# Patient Record
Sex: Female | Born: 1975 | Race: Black or African American | Hispanic: No | Smoking: Never smoker
Health system: Southern US, Community
[De-identification: ages and names within clinical notes are randomized; demographics above are authoritative.]

## PROBLEM LIST (undated history)

## (undated) DIAGNOSIS — I10 Essential (primary) hypertension: Secondary | ICD-10-CM

## (undated) DIAGNOSIS — G8929 Other chronic pain: Secondary | ICD-10-CM

## (undated) DIAGNOSIS — N1831 Chronic kidney disease, stage 3a: Secondary | ICD-10-CM

## (undated) DIAGNOSIS — R748 Abnormal levels of other serum enzymes: Secondary | ICD-10-CM

## (undated) DIAGNOSIS — E119 Type 2 diabetes mellitus without complications: Secondary | ICD-10-CM

## (undated) DIAGNOSIS — E785 Hyperlipidemia, unspecified: Secondary | ICD-10-CM

## (undated) DIAGNOSIS — R519 Headache, unspecified: Secondary | ICD-10-CM

## (undated) DIAGNOSIS — E1122 Type 2 diabetes mellitus with diabetic chronic kidney disease: Secondary | ICD-10-CM

## (undated) DIAGNOSIS — R000 Tachycardia, unspecified: Secondary | ICD-10-CM

## (undated) HISTORY — DX: Other chronic pain: G89.29

## (undated) HISTORY — DX: Type 2 diabetes mellitus without complications: E11.9

## (undated) HISTORY — DX: Hyperlipidemia, unspecified: E78.5

## (undated) HISTORY — DX: Essential (primary) hypertension: I10

## (undated) HISTORY — PX: PARTIAL HYSTERECTOMY: SHX80

## (undated) HISTORY — DX: Headache, unspecified: R51.9

---

## 2004-03-17 ENCOUNTER — Ambulatory Visit: Payer: Self-pay | Admitting: Obstetrics and Gynecology

## 2010-03-24 ENCOUNTER — Ambulatory Visit: Payer: Self-pay | Admitting: Obstetrics and Gynecology

## 2010-12-26 ENCOUNTER — Ambulatory Visit: Payer: Self-pay | Admitting: Obstetrics and Gynecology

## 2010-12-26 DIAGNOSIS — I10 Essential (primary) hypertension: Secondary | ICD-10-CM

## 2011-01-01 ENCOUNTER — Ambulatory Visit: Payer: Self-pay | Admitting: Obstetrics and Gynecology

## 2011-01-04 LAB — PATHOLOGY REPORT

## 2011-08-26 ENCOUNTER — Emergency Department: Payer: Self-pay | Admitting: Emergency Medicine

## 2011-08-26 LAB — COMPREHENSIVE METABOLIC PANEL
Albumin: 3.6 g/dL (ref 3.4–5.0)
Anion Gap: 6 — ABNORMAL LOW (ref 7–16)
BUN: 9 mg/dL (ref 7–18)
Chloride: 105 mmol/L (ref 98–107)
Co2: 27 mmol/L (ref 21–32)
Creatinine: 0.89 mg/dL (ref 0.60–1.30)
EGFR (African American): >60
Osmolality: 274 (ref 275–301)
Potassium: 3.5 mmol/L (ref 3.5–5.1)
SGOT(AST): 38 U/L — ABNORMAL HIGH (ref 15–37)
Total Protein: 8.4 g/dL — ABNORMAL HIGH (ref 6.4–8.2)

## 2011-08-26 LAB — CBC
HCT: 41.6 % (ref 35.0–47.0)
HGB: 13.7 g/dL (ref 12.0–16.0)
MCHC: 33 g/dL (ref 32.0–36.0)
WBC: 8.3 10*3/uL (ref 3.6–11.0)

## 2011-08-26 LAB — TROPONIN I: Troponin-I: <0.02 ng/mL

## 2013-06-30 ENCOUNTER — Ambulatory Visit: Payer: Self-pay | Admitting: Obstetrics and Gynecology

## 2014-06-02 ENCOUNTER — Emergency Department: Payer: Self-pay | Admitting: Emergency Medicine

## 2014-11-15 DIAGNOSIS — E669 Obesity, unspecified: Secondary | ICD-10-CM | POA: Insufficient documentation

## 2014-11-15 DIAGNOSIS — I10 Essential (primary) hypertension: Secondary | ICD-10-CM | POA: Insufficient documentation

## 2015-11-25 ENCOUNTER — Encounter (INDEPENDENT_AMBULATORY_CARE_PROVIDER_SITE_OTHER): Payer: 59 | Admitting: Ophthalmology

## 2015-11-25 DIAGNOSIS — H33301 Unspecified retinal break, right eye: Secondary | ICD-10-CM | POA: Diagnosis not present

## 2015-11-25 DIAGNOSIS — H43813 Vitreous degeneration, bilateral: Secondary | ICD-10-CM | POA: Diagnosis not present

## 2015-11-25 DIAGNOSIS — H35413 Lattice degeneration of retina, bilateral: Secondary | ICD-10-CM

## 2015-12-09 ENCOUNTER — Ambulatory Visit (INDEPENDENT_AMBULATORY_CARE_PROVIDER_SITE_OTHER): Payer: 59 | Admitting: Ophthalmology

## 2015-12-09 DIAGNOSIS — H33301 Unspecified retinal break, right eye: Secondary | ICD-10-CM

## 2015-12-23 ENCOUNTER — Ambulatory Visit (INDEPENDENT_AMBULATORY_CARE_PROVIDER_SITE_OTHER): Payer: 59 | Admitting: Ophthalmology

## 2015-12-23 DIAGNOSIS — H33301 Unspecified retinal break, right eye: Secondary | ICD-10-CM

## 2016-01-18 ENCOUNTER — Other Ambulatory Visit: Payer: Self-pay | Admitting: Obstetrics and Gynecology

## 2016-01-18 DIAGNOSIS — Z1231 Encounter for screening mammogram for malignant neoplasm of breast: Secondary | ICD-10-CM

## 2016-02-14 ENCOUNTER — Other Ambulatory Visit: Payer: Self-pay | Admitting: Obstetrics and Gynecology

## 2016-02-14 ENCOUNTER — Ambulatory Visit
Admission: RE | Admit: 2016-02-14 | Discharge: 2016-02-14 | Disposition: A | Discharge status: Home / Self Care | Payer: 59 | Source: Ambulatory Visit | Attending: Obstetrics and Gynecology | Admitting: Obstetrics and Gynecology

## 2016-02-14 DIAGNOSIS — Z1231 Encounter for screening mammogram for malignant neoplasm of breast: Secondary | ICD-10-CM | POA: Insufficient documentation

## 2016-04-27 ENCOUNTER — Ambulatory Visit (INDEPENDENT_AMBULATORY_CARE_PROVIDER_SITE_OTHER): Payer: 59 | Admitting: Ophthalmology

## 2016-05-18 ENCOUNTER — Ambulatory Visit (INDEPENDENT_AMBULATORY_CARE_PROVIDER_SITE_OTHER): Payer: 59 | Admitting: Ophthalmology

## 2016-05-18 DIAGNOSIS — H35413 Lattice degeneration of retina, bilateral: Secondary | ICD-10-CM

## 2016-05-18 DIAGNOSIS — H33301 Unspecified retinal break, right eye: Secondary | ICD-10-CM | POA: Diagnosis not present

## 2016-05-18 DIAGNOSIS — H43813 Vitreous degeneration, bilateral: Secondary | ICD-10-CM | POA: Diagnosis not present

## 2016-05-28 DIAGNOSIS — I10 Essential (primary) hypertension: Secondary | ICD-10-CM | POA: Diagnosis not present

## 2016-05-28 DIAGNOSIS — G43009 Migraine without aura, not intractable, without status migrainosus: Secondary | ICD-10-CM | POA: Diagnosis not present

## 2016-07-17 ENCOUNTER — Encounter: Payer: Self-pay | Admitting: *Deleted

## 2016-07-17 ENCOUNTER — Emergency Department
Admission: EM | Admit: 2016-07-17 | Discharge: 2016-07-17 | Disposition: A | Discharge status: Home / Self Care | Payer: 59 | Attending: Emergency Medicine | Admitting: Emergency Medicine

## 2016-07-17 DIAGNOSIS — I10 Essential (primary) hypertension: Secondary | ICD-10-CM

## 2016-07-17 DIAGNOSIS — R040 Epistaxis: Secondary | ICD-10-CM | POA: Diagnosis present

## 2016-07-17 MED ORDER — OXYMETAZOLINE HCL 0.05 % NA SOLN
3.0000 | Freq: Once | NASAL | Status: AC
  Administered 2016-07-17: 3 via NASAL
  Filled 2016-07-17: qty 15

## 2016-07-17 MED ORDER — TRANEXAMIC ACID 1000 MG/10ML IV SOLN
500.0000 mg | Freq: Once | INTRAVENOUS | Status: AC
  Administered 2016-07-17: 500 mg via TOPICAL
  Filled 2016-07-17: qty 10

## 2016-07-17 NOTE — ED Notes (Signed)
Pt reports 1hr PTA she woke up from sleep with nose bleed. Pt states she tried to stop it at home which did occur however the bleeding returned and "began to bleed from my eye so we came in." Pt denies hx of epistaxis, allergies or inj. Pt able to speak without difficulty. EDP in rm, clamp applied to nose. No bleeding from eyes visualized at this time. Pt A&O at this time.

## 2016-07-17 NOTE — ED Notes (Signed)
EDP aware of pts BP 

## 2016-07-17 NOTE — ED Provider Notes (Signed)
[facility] Emergency Department Provider Note  ____________________________________________   First MD Initiated Contact with Patient 07/17/16 0304     (approximate)  I have reviewed the triage vital signs and the nursing notes.   HISTORY  Chief Complaint Epistaxis    HPI Anne Harvey is a 41 y.o. female who comes to the emergency department with half hour of nontraumatic epistaxis. This has never happened before. She takes no blood thinners or antiplatelet agents. She was lying in bed when she began to bleed. She pinched her nose tilted her head backwards and it made her nauseated. She is thrown up once.   No past medical history on file.  There are no active problems to display for this patient.   No past surgical history on file.  Prior to Admission medications   Not on File    Allergies Morphine and related  Family History  Problem Relation Age of Onset  . Ovarian cancer Paternal Grandmother     Social History Social History  Substance Use Topics  . Smoking status: Never Smoker  . Smokeless tobacco: Never Used  . Alcohol use No    Review of Systems Constitutional: No fever/chills Eyes: No visual changes. ENT: No sore throat. Cardiovascular: Denies chest pain. Respiratory: Denies shortness of breath. Gastrointestinal: No abdominal pain.  No nausea, no vomiting.  No diarrhea.  No constipation. Genitourinary: Negative for dysuria. Musculoskeletal: Negative for back pain. Skin: Negative for rash. Neurological: Negative for headaches, focal weakness or numbness.  10-point ROS otherwise negative.  ____________________________________________   PHYSICAL EXAM:  VITAL SIGNS: ED Triage Vitals  Enc Vitals Group     BP 07/17/16 0255 (!) 237/135     Pulse Rate 07/17/16 0252 79     Resp 07/17/16 0252 20     Temp 07/17/16 0252 98.3 F (36.8 C)     Temp Source 07/17/16 0252 Oral     SpO2 07/17/16 0252 99 %     Weight  07/17/16 0253 200 lb (90.7 kg)     Height 07/17/16 0253  (1.651 m)     Head Circumference --      Peak Flow --      Pain Score --      Pain Loc --      Pain Edu? --      Excl. in GC? --     Constitutional: Alert and oriented x 4 well appearing nontoxic no diaphoresis speaks in full, clear sentences Eyes: PERRL EOMI.Mild bleeding around the inferior aspect of her left eye Head: Atraumatic. Nose: No congestion/rhinnorhea. Mouth/Throat: Bilateral anterior epistaxis. Normal oropharynx with no evidence of bleed Neck: No stridor.   Cardiovascular: Normal rate, regular rhythm. Grossly normal heart sounds.  Good peripheral circulation. Respiratory: Normal respiratory effort.  No retractions. Lungs CTAB and moving good air Musculoskeletal: No lower extremity edema   Neurologic:  Normal speech and language. No gross focal neurologic deficits are appreciated. Skin:  Skin is warm, dry and intact. No rash noted. Psychiatric: Mood and affect are normal. Speech and behavior are normal.    ____________________________________________   DIFFERENTIAL  Anterior epistaxis, posterior epistaxis ____________________________________________   LABS (all labs ordered are listed, but only abnormal results are displayed)  Labs Reviewed - No data to display   __________________________________________  EKG   ____________________________________________  RADIOLOGY   ____________________________________________   PROCEDURES  Procedure(s) performed: no  Procedures  Critical Care performed: no  ____________________________________________   INITIAL IMPRESSION / ASSESSMENT AND PLAN /  ED COURSE  Pertinent labs & imaging results that were available during my care of the patient were reviewed by me and considered in my medical decision making (see chart for details).  I initially had the patient blow her nose and a large amount of clot came out. I then packed each nares with a 2 x  2 that was soaked in tranexamic acid in pinched her nose together and held it for half hour. After removing the packing she continued a slow ooze. I then had her blow her nose and spray Afrin 3 times each nostril and clamped her nose for 20 minutes. When we removed it she had a slight Shukla blood and then it stopped. At this point I offered the patient packing versus continued pressure and waiting at home. She declines packing and states she will come back if her nosebleed continues. She is discharged home in improved and good condition.      ____________________________________________   FINAL CLINICAL IMPRESSION(S) / ED DIAGNOSES  Final diagnoses:  Acute anterior epistaxis  Hypertension, unspecified type      NEW MEDICATIONS STARTED DURING THIS VISIT:  New Prescriptions   No medications on file     Note:  This document was prepared using Dragon voice recognition software and may include unintentional dictation errors.     Merrily Brittle, MD 07/17/16 [phone]

## 2016-07-17 NOTE — ED Notes (Signed)

## 2016-07-17 NOTE — Discharge Instructions (Signed)
Please return to the emergency department for any new or worsening symptoms such as if his nose starts bleeding again or for any other concerns. Today her blood pressure was quite high, however part of this was because of the pain of the nosebleed. Regardless I need you to follow up with your PMD within 1 week for a blood pressure recheck.  It was a pleasure to take care of you today, and thank you for coming to our emergency department.  If you have any questions or concerns before leaving please ask the nurse to grab me and I'm more than happy to go through your aftercare instructions again.  If you were prescribed any opioid pain medication today such as Norco, Vicodin, Percocet, morphine, hydrocodone, or oxycodone please make sure you do not drive when you are taking this medication as it can alter your ability to drive safely.  If you have any concerns once you are home that you are not improving or are in fact getting worse before you can make it to your follow-up appointment, please do not hesitate to call 911 and come back for further evaluation.  Merrily Brittle MD

## 2016-07-17 NOTE — ED Triage Notes (Signed)
Pt states she woke up tonight with a nosebleed.  No bleeding at this time.  Pt has htn and took meds today.  Pt alert.  Speech clear.

## 2016-07-17 NOTE — ED Notes (Signed)
Pt reports she has hx of HTN, reports she takes Lisinopril in the am daily.

## 2016-08-14 DIAGNOSIS — R040 Epistaxis: Secondary | ICD-10-CM | POA: Insufficient documentation

## 2016-10-29 DIAGNOSIS — R399 Unspecified symptoms and signs involving the genitourinary system: Secondary | ICD-10-CM | POA: Diagnosis not present

## 2016-10-29 DIAGNOSIS — N760 Acute vaginitis: Secondary | ICD-10-CM | POA: Diagnosis not present

## 2016-10-29 DIAGNOSIS — B9689 Other specified bacterial agents as the cause of diseases classified elsewhere: Secondary | ICD-10-CM | POA: Diagnosis not present

## 2017-01-16 DIAGNOSIS — I10 Essential (primary) hypertension: Secondary | ICD-10-CM | POA: Diagnosis not present

## 2017-01-16 DIAGNOSIS — Z0000 Encounter for general adult medical examination without abnormal findings: Secondary | ICD-10-CM | POA: Diagnosis not present

## 2017-01-22 DIAGNOSIS — Z01419 Encounter for gynecological examination (general) (routine) without abnormal findings: Secondary | ICD-10-CM | POA: Diagnosis not present

## 2017-01-22 DIAGNOSIS — Z1231 Encounter for screening mammogram for malignant neoplasm of breast: Secondary | ICD-10-CM | POA: Diagnosis not present

## 2017-01-28 DIAGNOSIS — M779 Enthesopathy, unspecified: Secondary | ICD-10-CM | POA: Diagnosis not present

## 2017-01-28 DIAGNOSIS — S56912A Strain of unspecified muscles, fascia and tendons at forearm level, left arm, initial encounter: Secondary | ICD-10-CM | POA: Diagnosis not present

## 2017-03-21 ENCOUNTER — Other Ambulatory Visit: Payer: Self-pay | Admitting: Obstetrics and Gynecology

## 2017-03-21 DIAGNOSIS — Z1231 Encounter for screening mammogram for malignant neoplasm of breast: Secondary | ICD-10-CM

## 2017-04-18 ENCOUNTER — Ambulatory Visit
Admission: RE | Admit: 2017-04-18 | Discharge: 2017-04-18 | Disposition: A | Discharge status: Home / Self Care | Payer: 59 | Source: Ambulatory Visit | Attending: Obstetrics and Gynecology | Admitting: Obstetrics and Gynecology

## 2017-04-18 DIAGNOSIS — Z1231 Encounter for screening mammogram for malignant neoplasm of breast: Secondary | ICD-10-CM | POA: Diagnosis present

## 2017-05-24 ENCOUNTER — Ambulatory Visit (INDEPENDENT_AMBULATORY_CARE_PROVIDER_SITE_OTHER): Payer: 59 | Admitting: Ophthalmology

## 2017-07-26 DIAGNOSIS — I10 Essential (primary) hypertension: Secondary | ICD-10-CM | POA: Diagnosis not present

## 2018-03-04 DIAGNOSIS — S86811A Strain of other muscle(s) and tendon(s) at lower leg level, right leg, initial encounter: Secondary | ICD-10-CM | POA: Diagnosis not present

## 2018-04-10 DIAGNOSIS — J101 Influenza due to other identified influenza virus with other respiratory manifestations: Secondary | ICD-10-CM | POA: Diagnosis not present

## 2018-04-10 DIAGNOSIS — R52 Pain, unspecified: Secondary | ICD-10-CM | POA: Diagnosis not present

## 2018-04-10 DIAGNOSIS — R509 Fever, unspecified: Secondary | ICD-10-CM | POA: Diagnosis not present

## 2018-04-23 ENCOUNTER — Other Ambulatory Visit: Payer: Self-pay | Admitting: Family Medicine

## 2018-04-23 DIAGNOSIS — Z1231 Encounter for screening mammogram for malignant neoplasm of breast: Secondary | ICD-10-CM

## 2018-05-15 ENCOUNTER — Ambulatory Visit
Admission: RE | Admit: 2018-05-15 | Discharge: 2018-05-15 | Disposition: A | Discharge status: Home / Self Care | Payer: Managed Care, Other (non HMO) | Source: Ambulatory Visit | Attending: Family Medicine | Admitting: Family Medicine

## 2018-05-15 DIAGNOSIS — Z1231 Encounter for screening mammogram for malignant neoplasm of breast: Secondary | ICD-10-CM | POA: Diagnosis not present

## 2018-11-18 ENCOUNTER — Other Ambulatory Visit: Payer: Self-pay

## 2018-11-18 DIAGNOSIS — Z20822 Contact with and (suspected) exposure to covid-19: Secondary | ICD-10-CM

## 2018-11-19 LAB — NOVEL CORONAVIRUS, NAA: SARS-CoV-2, NAA: NOT DETECTED

## 2018-11-20 ENCOUNTER — Telehealth: Payer: Self-pay | Admitting: Family Medicine

## 2018-11-20 NOTE — Telephone Encounter (Signed)
Pt given Covid-19 results °

## 2019-03-31 DIAGNOSIS — R7309 Other abnormal glucose: Secondary | ICD-10-CM | POA: Insufficient documentation

## 2019-03-31 DIAGNOSIS — E785 Hyperlipidemia, unspecified: Secondary | ICD-10-CM | POA: Insufficient documentation

## 2019-07-01 ENCOUNTER — Other Ambulatory Visit: Payer: Self-pay | Admitting: Family Medicine

## 2019-07-01 DIAGNOSIS — Z1231 Encounter for screening mammogram for malignant neoplasm of breast: Secondary | ICD-10-CM

## 2019-07-11 ENCOUNTER — Ambulatory Visit: Payer: Managed Care, Other (non HMO) | Attending: Internal Medicine

## 2019-07-11 DIAGNOSIS — Z23 Encounter for immunization: Secondary | ICD-10-CM

## 2019-07-11 NOTE — Progress Notes (Signed)
   Covid-19 Vaccination Clinic  Name:  Anne Harvey    MRN: [id number] DOB: 16-Feb-1976  07/11/2019  Anne Harvey was observed post Covid-19 immunization for 15 minutes without incident. She was provided with Vaccine Information Sheet and instruction to access the V-Safe system.   Anne Harvey was instructed to call 911 with any severe reactions post vaccine: Marland Kitchen Difficulty breathing  . Swelling of face and throat  . A fast heartbeat  . A bad rash all over body  . Dizziness and weakness   Immunizations Administered    Name Date Dose VIS Date Route   Pfizer COVID-19 Vaccine 07/11/2019  5:29 PM 0.3 mL 03/27/2019 Intramuscular   Manufacturer: [employer], [employer]   Lot: [id number]   NDC: [address]-2

## 2019-08-01 ENCOUNTER — Ambulatory Visit: Payer: Managed Care, Other (non HMO) | Attending: Internal Medicine

## 2019-08-01 DIAGNOSIS — Z23 Encounter for immunization: Secondary | ICD-10-CM

## 2019-08-01 NOTE — Progress Notes (Signed)
   Covid-19 Vaccination Clinic  Name:  Anne Harvey    MRN: [id number] DOB: 1975/06/23  08/01/2019  Anne Harvey was observed post Covid-19 immunization for 15 minutes without incident. She was provided with Vaccine Information Sheet and instruction to access the V-Safe system.   Anne Harvey was instructed to call 911 with any severe reactions post vaccine: Marland Kitchen Difficulty breathing  . Swelling of face and throat  . A fast heartbeat  . A bad rash all over body  . Dizziness and weakness   Immunizations Administered    Name Date Dose VIS Date Route   Pfizer COVID-19 Vaccine 08/01/2019  3:10 PM 0.3 mL 03/27/2019 Intramuscular   Manufacturer: [employer], [employer]   Lot: [id number]   NDC: [address]-6

## 2019-09-15 ENCOUNTER — Ambulatory Visit
Admission: RE | Admit: 2019-09-15 | Discharge: 2019-09-15 | Disposition: A | Discharge status: Home / Self Care | Payer: Managed Care, Other (non HMO) | Source: Ambulatory Visit | Attending: Family Medicine | Admitting: Family Medicine

## 2019-09-15 DIAGNOSIS — Z1231 Encounter for screening mammogram for malignant neoplasm of breast: Secondary | ICD-10-CM | POA: Diagnosis not present

## 2020-12-05 ENCOUNTER — Encounter: Payer: Managed Care, Other (non HMO) | Attending: Family Medicine | Admitting: *Deleted

## 2020-12-05 ENCOUNTER — Encounter: Payer: Self-pay | Admitting: *Deleted

## 2020-12-05 ENCOUNTER — Other Ambulatory Visit: Payer: Self-pay

## 2020-12-05 VITALS — BP 124/90 | Ht 65.5 in | Wt 222.5 lb

## 2020-12-05 DIAGNOSIS — Z6836 Body mass index (BMI) 36.0-36.9, adult: Secondary | ICD-10-CM | POA: Insufficient documentation

## 2020-12-05 DIAGNOSIS — E119 Type 2 diabetes mellitus without complications: Secondary | ICD-10-CM | POA: Diagnosis not present

## 2020-12-05 DIAGNOSIS — E669 Obesity, unspecified: Secondary | ICD-10-CM | POA: Insufficient documentation

## 2020-12-05 DIAGNOSIS — Z713 Dietary counseling and surveillance: Secondary | ICD-10-CM | POA: Diagnosis not present

## 2020-12-05 NOTE — Progress Notes (Signed)
Diabetes Self-Management Education  Visit Type: First/Initial  Appt. Start Time: 1550 Appt. End Time: 1700  12/05/2020  [name], identified by name and date of birth, is a 45 y.o. female with a diagnosis of Diabetes: Type 2.   ASSESSMENT  Blood pressure 124/90, height 5' 5.5" (1.664 m), weight 222 lb 8 oz (100.9 kg). Body mass index is 36.46 kg/m.   Diabetes Self-Management Education - 12/05/20 1911       Visit Information   Visit Type First/Initial      Initial Visit   Diabetes Type Type 2    Are you currently following a meal plan? No    Are you taking your medications as prescribed? Yes    Date Diagnosed she reports "hasn't been officially diagnosed with Diabetes" - A1C of 6.6 %      Health Coping   How would you rate your overall health? Good      Psychosocial Assessment   Patient Belief/Attitude about Diabetes Other (comment)   "nervous"   Self-care barriers None    Self-management support Doctor's office;Family    Patient Concerns Nutrition/Meal planning;Weight Control;Glycemic Control;Medication;Healthy Lifestyle    Special Needs None    Preferred Learning Style Auditory;Visual;Hands on    Learning Readiness Ready    How often do you need to have someone help you when you read instructions, pamphlets, or other written materials from your doctor or pharmacy? 1 - Never    What is the last grade level you completed in school? some college      Pre-Education Assessment   Patient understands the diabetes disease and treatment process. Needs Instruction    Patient understands incorporating nutritional management into lifestyle. Needs Instruction    Patient undertands incorporating physical activity into lifestyle. Needs Instruction    Patient understands using medications safely. Needs Instruction    Patient understands monitoring blood glucose, interpreting and using results Needs Instruction    Patient understands prevention, detection, and treatment of  acute complications. Needs Instruction    Patient understands prevention, detection, and treatment of chronic complications. Needs Instruction    Patient understands how to develop strategies to address psychosocial issues. Needs Instruction    Patient understands how to develop strategies to promote health/change behavior. Needs Instruction      Complications   Last HgB A1C per patient/outside source 6.6 %   10/19/2020   How often do you check your blood sugar? 0 times/day (not testing)   Provided One Touch Verio Reflect and instructed on use. BG upon return demonstration was 136 mg/dL at 4:50 pm - 2 hrs pp.   Have you had a dilated eye exam in the past 12 months? Yes    Have you had a dental exam in the past 12 months? No    Are you checking your feet? No      Dietary Intake   Breakfast 2 eggs with bacon, sometimes fruit (grapes, watermelon, strawberries, cantaloup, occasional banana)    Lunch sometimes skips - salad, hamburger, noodles, only water - has Chief Technology Officer, fish, pork chops, beef, roast; potatoes, pasta, mac-n-cheese, pinto beans, carrots, greens, broccoli, asparagus, green beans    Beverage(s) water, diet soda, artificial sweeteners like crystal light      Exercise   Exercise Type ADL's      Patient Education   Previous Diabetes Education No    Disease state  Definition of diabetes, type 1 and 2, and the diagnosis of diabetes;Factors that contribute to  the development of diabetes    Nutrition management  Role of diet in the treatment of diabetes and the relationship between the three main macronutrients and blood glucose level;Food label reading, portion sizes and measuring food.;Reviewed blood glucose goals for pre and post meals and how to evaluate the patients' food intake on their blood glucose level.    Physical activity and exercise  Role of exercise on diabetes management, blood pressure control and cardiac health.    Monitoring Taught/evaluated SMBG  meter.;Purpose and frequency of SMBG.;Taught/discussed recording of test results and interpretation of SMBG.;Identified appropriate SMBG and/or A1C goals.    Chronic complications Relationship between chronic complications and blood glucose control    Psychosocial adjustment Role of stress on diabetes;Identified and addressed patients feelings and concerns about diabetes      Individualized Goals (developed by patient)   Reducing Risk Other (comment)   improve blood sugars, decrease medications, lose weight, lead a healthier lifestyle     Outcomes   Expected Outcomes Demonstrated interest in learning. Expect positive outcomes    Future DMSE 4-6 wks             Individualized Plan for Diabetes Self-Management Training:   Learning Objective:  Patient will have a greater understanding of diabetes self-management. Patient education plan is to attend individual and/or group sessions per assessed needs and concerns.   Plan:   Patient Instructions  Check blood sugars 2 x day before breakfast and 2 hrs after supper 3 x week Bring blood sugar records to the next appointment  Call your doctor for a prescription for:  1. Meter strips (type)  One Touch Verio  checking  3   times per week  2. Lancets (type)  One Touch Delica  checking  3   times per week  Exercise:  Begin walking  for    15  minutes   3  days a week and gradually increase to 30 minutes 5 x week  Eat 3 meals day,   1  snack a day Space meals 4-6 hours apart Don't' skip meals - eat at least 1 protein and 1 carbohydrate serving Complete 3 Day Food Record and bring to next appt  Return for appointment on:  Thursday January 12, 2021 at 3:15 pm with Pam (dietitian)   Expected Outcomes:  Demonstrated interest in learning. Expect positive outcomes  Education material provided:  General Meal Planning Guidelines Simple Meal Plan Meter = One Touch Verio Reflect 3 Day Food Record  If problems or questions, patient to  contact team via:   Johny Drilling, RN, [place] [phone]  Future DSME appointment: 4-6 wks January 12, 2021 with the dietitian

## 2020-12-05 NOTE — Patient Instructions (Signed)
Check blood sugars 2 x day before breakfast and 2 hrs after supper 3 x week Bring blood sugar records to the next appointment  Call your doctor for a prescription for:  1. Meter strips (type)  One Touch Verio  checking  3   times per week  2. Lancets (type)  One Touch Delica  checking  3   times per week  Exercise:  Begin walking  for    15  minutes   3  days a week and gradually increase to 30 minutes 5 x week  Eat 3 meals day,   1  snack a day Space meals 4-6 hours apart Don't' skip meals - eat at least 1 protein and 1 carbohydrate serving Complete 3 Day Food Record and bring to next appt  Return for appointment on:  Thursday January 12, 2021 at 3:15 pm with [facility] (dietitian)

## 2021-01-12 ENCOUNTER — Ambulatory Visit: Payer: Managed Care, Other (non HMO) | Admitting: Dietician

## 2021-01-13 ENCOUNTER — Encounter: Payer: Self-pay | Admitting: Dietician

## 2021-01-13 NOTE — Progress Notes (Signed)
[name] has rescheduled her appointment from 01/12/21 to 03/04/21 to complete her diabetes education.

## 2021-02-24 DIAGNOSIS — M179 Osteoarthritis of knee, unspecified: Secondary | ICD-10-CM | POA: Insufficient documentation

## 2021-03-02 ENCOUNTER — Ambulatory Visit: Payer: Managed Care, Other (non HMO) | Admitting: Dietician

## 2021-03-15 ENCOUNTER — Encounter: Payer: Self-pay | Admitting: Podiatry

## 2021-03-15 ENCOUNTER — Other Ambulatory Visit: Payer: Self-pay

## 2021-03-15 ENCOUNTER — Ambulatory Visit: Payer: Managed Care, Other (non HMO) | Admitting: Podiatry

## 2021-03-15 DIAGNOSIS — L03032 Cellulitis of left toe: Secondary | ICD-10-CM | POA: Diagnosis not present

## 2021-03-15 DIAGNOSIS — L03039 Cellulitis of unspecified toe: Secondary | ICD-10-CM

## 2021-03-15 MED ORDER — CEPHALEXIN 500 MG PO CAPS: 500.0000 mg | ORAL_CAPSULE | Freq: Three times a day (TID) | ORAL | 0 refills | Status: AC

## 2021-03-15 NOTE — Progress Notes (Signed)
  Subjective:  Patient ID: Anne Harvey, female    DOB: 10-02-1975,  MRN: [id number]  Chief Complaint  Patient presents with   Ingrown Toenail    np - ingrown left great nail    45 y.o. female presents with the above complaint. History confirmed with patient.  They become painful and infected about 3 to 4 months ago and really worsened over the last month its been blistering and having purulent drainage  Objective:  Physical Exam: warm, good capillary refill, no trophic changes or ulcerative lesions, normal DP and PT pulses, and normal sensory exam. Left Foot: normal exam, no swelling, tenderness, instability; ligaments intact, full range of motion of all ankle/foot joints Right Foot: Loosening of hallux nail with paronychia that extends from the lateral border to the proximal nail fold  Assessment:   1. Paronychia of great toe      Plan:  Patient was evaluated and treated and all questions answered.  Has paronychia of nearly the entire nail plate with extension of the proximal nail fold.  I recommended I&D with temporary avulsion of the nail plate.  I think the causative issue was an ingrown toenail.  After sterile prep with Betadine and a digital block with lidocaine and Marcaine I remove the entire nail plate and did not perform a matricectomy.  I will see her back as needed and let the toenail regrow and if the ingrown nail becomes symptomatic and we will plan for partial permanent nail avulsion  Return if symptoms worsen or fail to improve.

## 2021-03-15 NOTE — Patient Instructions (Signed)

## 2021-03-22 ENCOUNTER — Encounter: Payer: Self-pay | Admitting: Dietician

## 2021-03-22 NOTE — Progress Notes (Signed)
Have not heard back from patient to reschedule her second cancelled appointment from 03/02/21. Sent notification to referring provider.

## 2021-04-05 ENCOUNTER — Ambulatory Visit: Payer: Managed Care, Other (non HMO) | Admitting: Podiatry

## 2021-04-05 ENCOUNTER — Other Ambulatory Visit: Payer: Self-pay

## 2021-04-05 DIAGNOSIS — L03039 Cellulitis of unspecified toe: Secondary | ICD-10-CM | POA: Diagnosis not present

## 2021-04-05 NOTE — Progress Notes (Signed)
°  Subjective:  Patient ID: Anne Harvey, female    DOB: [birth year],  MRN: [id number]  Chief Complaint  Patient presents with   Ingrown Toenail      follow up on toenail removal    45 y.o. female presents with the above complaint. History confirmed with patient.  Here for follow-up is doing well its not hurting there is a dark area she is concerned about  Objective:  Physical Exam: warm, good capillary refill, no trophic changes or ulcerative lesions, normal DP and PT pulses, and normal sensory exam.  Left foot: Avulsion site is healing well there is a small dark area that appears to be dried blood no signs of infection or cellulitis or drainage  Assessment:   1. Paronychia of great toe      Plan:  Patient was evaluated and treated and all questions answered.  Doing well she can discontinue soaks and ointment which she has already done.  She may resume pedicures for the toenails at this point.  I debrided the area of the dried skin and she will try soaking and applying lotion.  Return to see me as needed as the nail grows back and gives her further issues  Return if symptoms worsen or fail to improve.

## 2021-11-07 ENCOUNTER — Other Ambulatory Visit (HOSPITAL_COMMUNITY)
Admission: RE | Admit: 2021-11-07 | Discharge: 2021-11-07 | Disposition: A | Discharge status: Home / Self Care | Payer: Managed Care, Other (non HMO) | Source: Ambulatory Visit | Attending: Obstetrics and Gynecology | Admitting: Obstetrics and Gynecology

## 2021-11-07 ENCOUNTER — Other Ambulatory Visit: Payer: Self-pay | Admitting: Obstetrics and Gynecology

## 2021-11-07 DIAGNOSIS — Z01419 Encounter for gynecological examination (general) (routine) without abnormal findings: Secondary | ICD-10-CM | POA: Insufficient documentation

## 2021-11-14 LAB — CYTOLOGY - PAP
Comment: NEGATIVE
Diagnosis: NEGATIVE
High risk HPV: NEGATIVE

## 2022-03-16 HISTORY — PX: REDUCTION MAMMAPLASTY: SUR839

## 2022-05-06 IMAGING — MG DIGITAL SCREENING BILAT W/ TOMO W/ CAD
6 of 10 series · 6 of 30 positions shown · non-contrast
Comparison: Previous exam(s).

CLINICAL DATA: Screening.

EXAM:
DIGITAL SCREENING BILATERAL MAMMOGRAM WITH TOMO AND CAD

[L MLO synth-2D (1 of 2)]
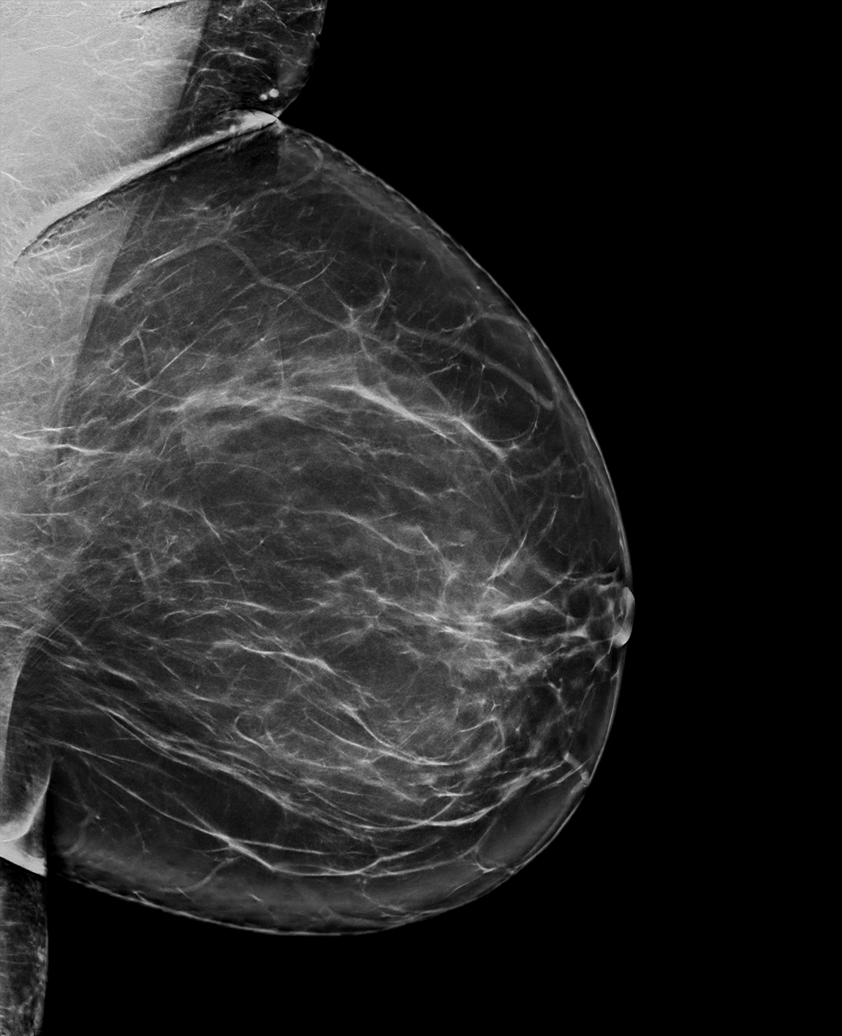

[L MLO synth-2D (2 of 2)]
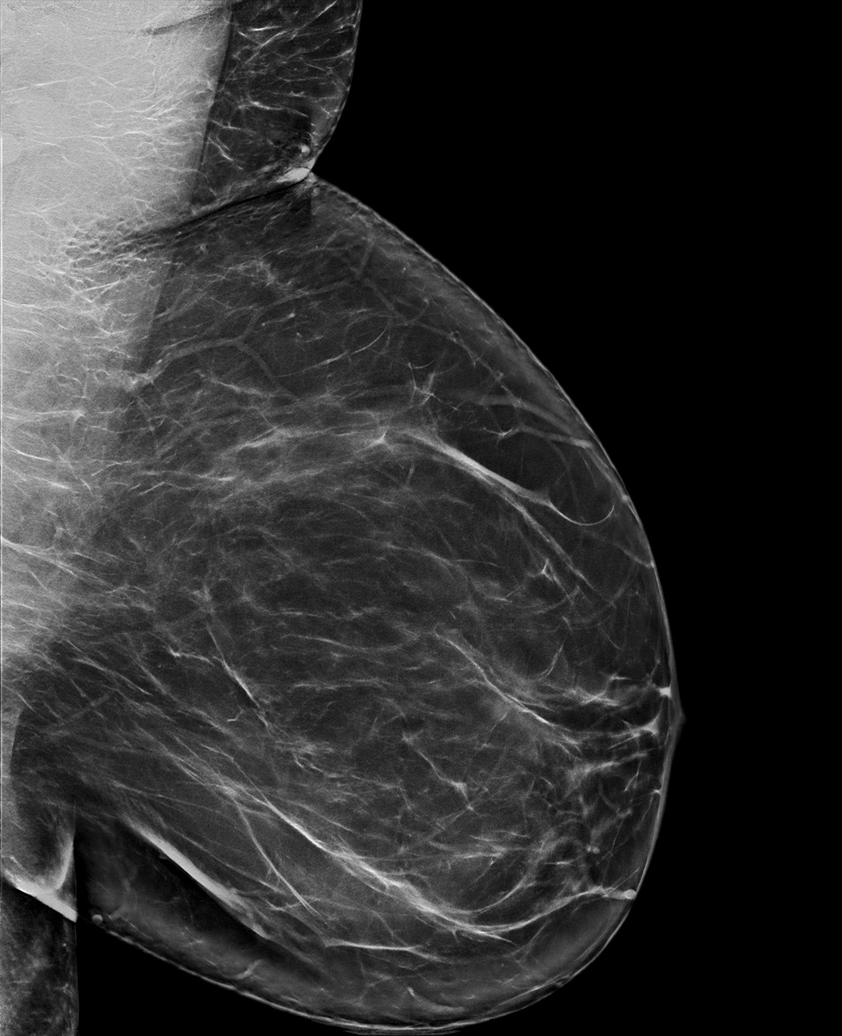

[R MLO synth-2D]
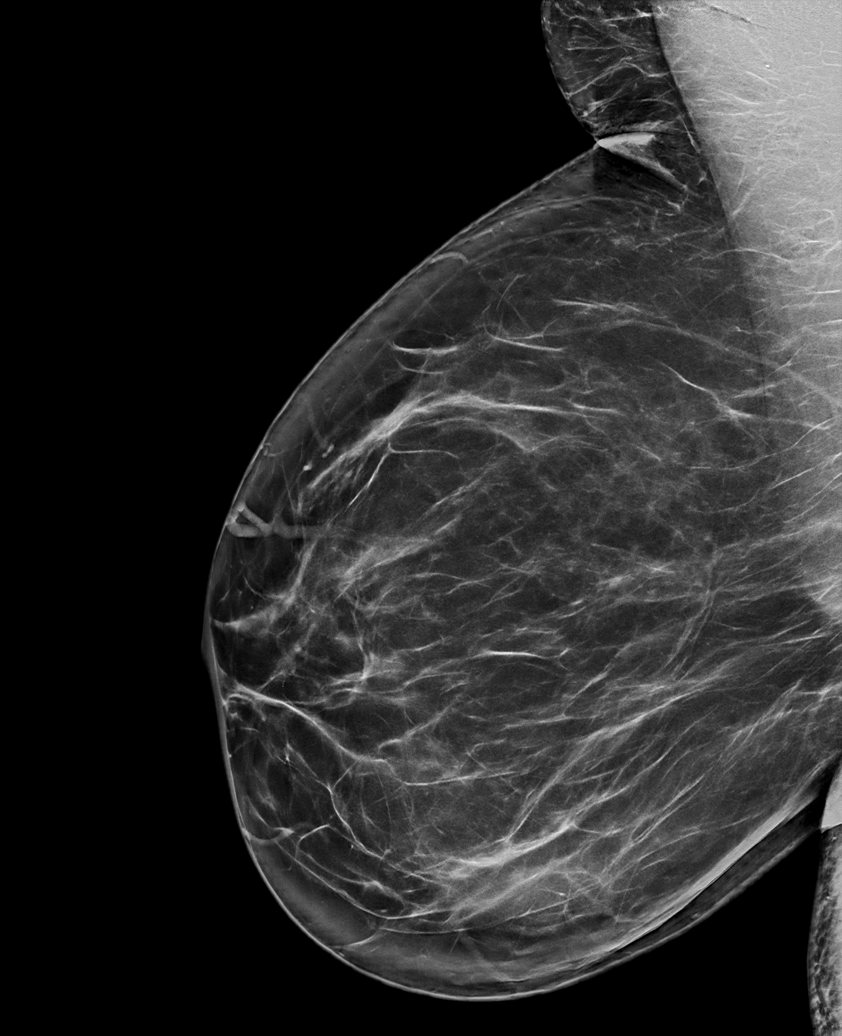

[R CC synth-2D]
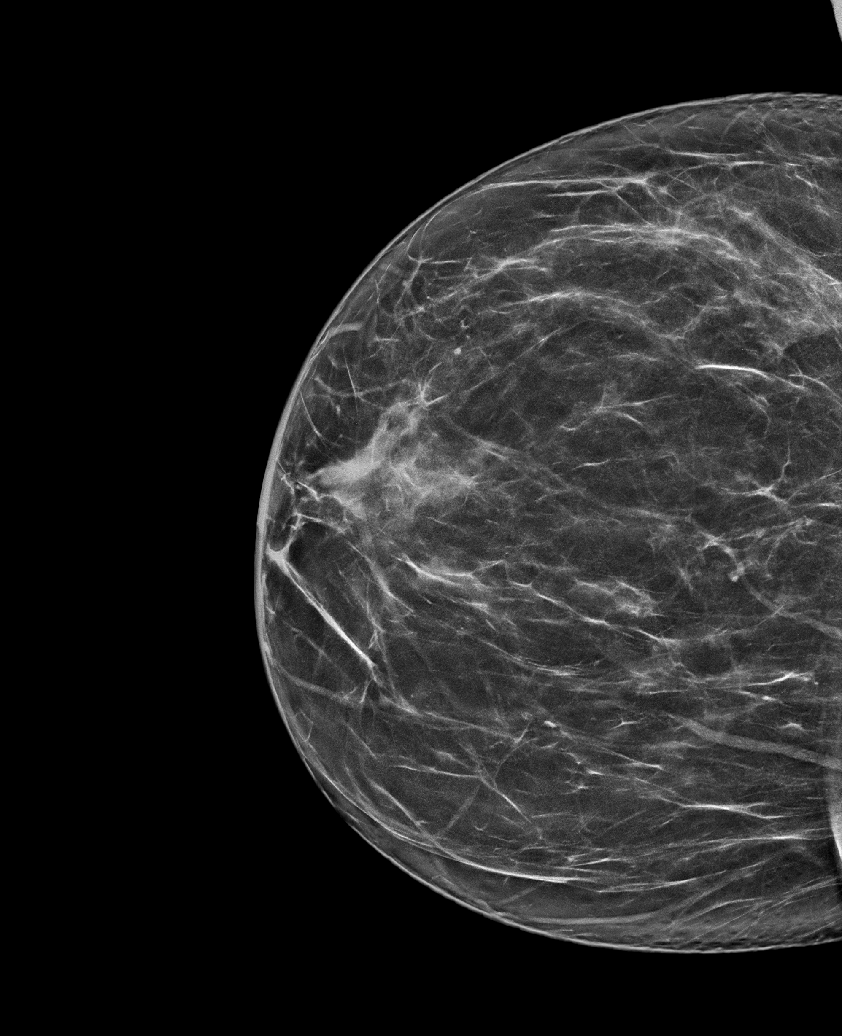

[L CC synth-2D]
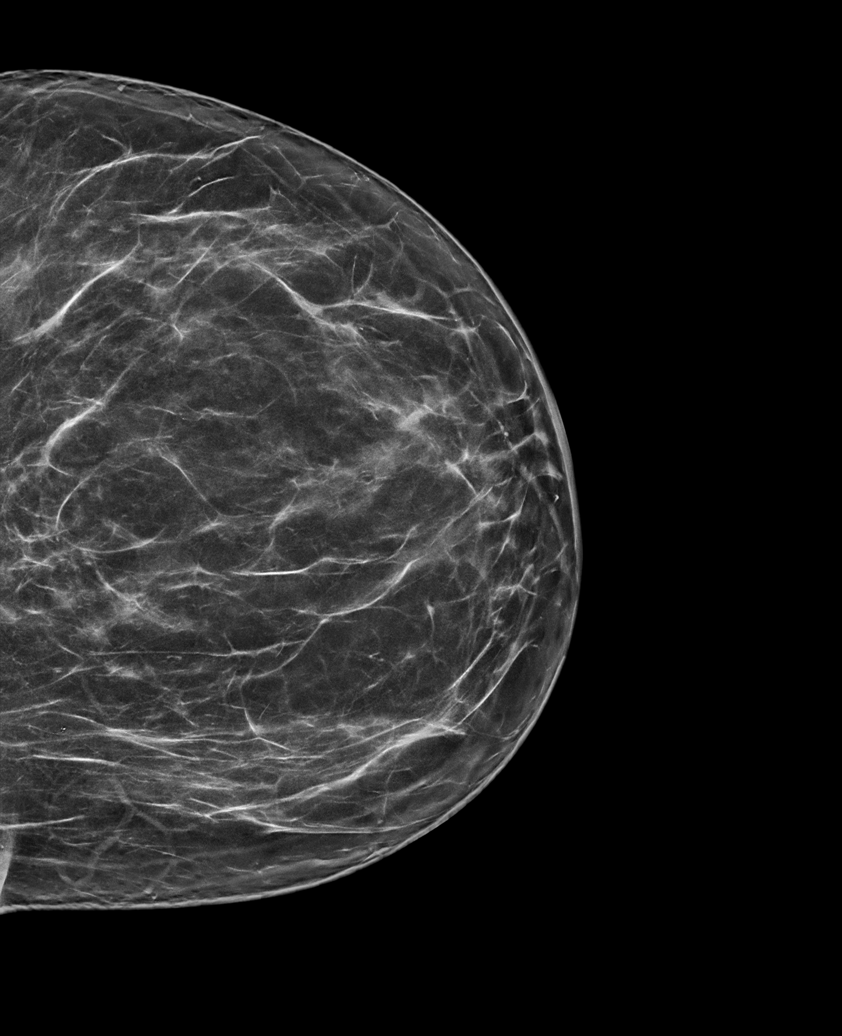

[L MLO tomo · tomo slice 49/97.0]
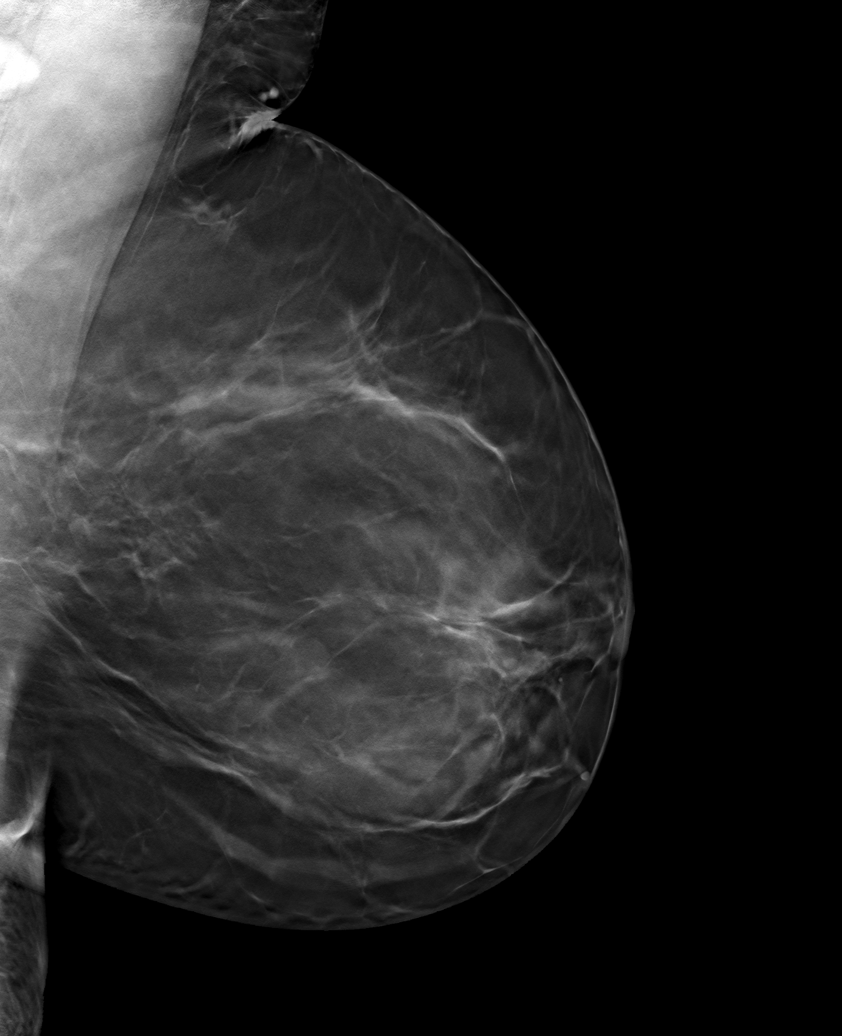

[6 of 30 positions shown; findings below may reference images not displayed]

ACR Breast Density Category b: There are scattered areas of
fibroglandular density.
FINDINGS: There are no findings suspicious for malignancy. Images were
processed with CAD.
IMPRESSION: No mammographic evidence of malignancy. A result letter of this
screening mammogram will be mailed directly to the patient.

RECOMMENDATION:
Screening mammogram in one year. (Code:[id number])

BI-RADS CATEGORY  1: Negative.

## 2022-11-13 ENCOUNTER — Other Ambulatory Visit: Payer: Self-pay | Admitting: Obstetrics and Gynecology

## 2022-11-13 DIAGNOSIS — Z1231 Encounter for screening mammogram for malignant neoplasm of breast: Secondary | ICD-10-CM

## 2022-12-10 ENCOUNTER — Ambulatory Visit
Admission: RE | Admit: 2022-12-10 | Discharge: 2022-12-10 | Disposition: A | Discharge status: Home / Self Care | Payer: Managed Care, Other (non HMO) | Source: Ambulatory Visit | Attending: Obstetrics and Gynecology | Admitting: Obstetrics and Gynecology

## 2022-12-10 DIAGNOSIS — Z1231 Encounter for screening mammogram for malignant neoplasm of breast: Secondary | ICD-10-CM | POA: Diagnosis present

## 2023-12-02 ENCOUNTER — Other Ambulatory Visit: Payer: Self-pay | Admitting: Obstetrics and Gynecology

## 2023-12-02 DIAGNOSIS — Z1231 Encounter for screening mammogram for malignant neoplasm of breast: Secondary | ICD-10-CM

## 2023-12-04 ENCOUNTER — Inpatient Hospital Stay: Admission: RE | Admit: 2023-12-04 | Source: Ambulatory Visit

## 2023-12-04 ENCOUNTER — Encounter: Payer: Self-pay | Admitting: Family Medicine

## 2023-12-20 ENCOUNTER — Ambulatory Visit: Admitting: Family Medicine

## 2023-12-20 ENCOUNTER — Encounter: Payer: Self-pay | Admitting: Family Medicine

## 2023-12-20 VITALS — BP 134/86 | HR 60 | Temp 98.5°F | Ht 65.5 in | Wt 222.0 lb

## 2023-12-20 DIAGNOSIS — R252 Cramp and spasm: Secondary | ICD-10-CM

## 2023-12-20 DIAGNOSIS — R109 Unspecified abdominal pain: Secondary | ICD-10-CM

## 2023-12-20 DIAGNOSIS — Z862 Personal history of diseases of the blood and blood-forming organs and certain disorders involving the immune mechanism: Secondary | ICD-10-CM

## 2023-12-20 DIAGNOSIS — I10 Essential (primary) hypertension: Secondary | ICD-10-CM

## 2023-12-20 DIAGNOSIS — Z7689 Persons encountering health services in other specified circumstances: Secondary | ICD-10-CM

## 2023-12-20 DIAGNOSIS — E785 Hyperlipidemia, unspecified: Secondary | ICD-10-CM | POA: Diagnosis not present

## 2023-12-20 DIAGNOSIS — G8929 Other chronic pain: Secondary | ICD-10-CM | POA: Insufficient documentation

## 2023-12-20 DIAGNOSIS — Z1159 Encounter for screening for other viral diseases: Secondary | ICD-10-CM

## 2023-12-20 DIAGNOSIS — Z6836 Body mass index (BMI) 36.0-36.9, adult: Secondary | ICD-10-CM

## 2023-12-20 DIAGNOSIS — E66812 Obesity, class 2: Secondary | ICD-10-CM

## 2023-12-20 DIAGNOSIS — Z114 Encounter for screening for human immunodeficiency virus [HIV]: Secondary | ICD-10-CM

## 2023-12-20 MED ORDER — HYDROCHLOROTHIAZIDE 12.5 MG PO CAPS: 12.5000 mg | ORAL_CAPSULE | Freq: Every day | ORAL | 0 refills | Status: DC

## 2023-12-20 MED ORDER — OLMESARTAN MEDOXOMIL 40 MG PO TABS: 40.0000 mg | ORAL_TABLET | Freq: Every day | ORAL | 0 refills | Status: DC

## 2023-12-20 NOTE — Progress Notes (Signed)
 New Patient Office Visit  Subjective   Patient ID: Anne Harvey, female    DOB: 1976/03/16  Age: 48 y.o. MRN: [id number]  CC:  Chief Complaint  Patient presents with   Establish Care   GI Problem   Leg Pain    HPI Anne Harvey presents to establish care with new provider.  Patients previous primary care provider: [facility] with Dr. Alm Na. Last seen 2024.  Specialist: [facility] with Dr. Rexene Hoit   Hyperlipidemia: Chronic. Patient is taking Atorvastatin 80mg  daily.   Patient is complaining of severe leg cramps at night time. On average it occurs about 2 times a month. Initially started around the time of starting Atorvastatin. In the past, has not took Atorvastatin, still had the muscle cramps, but not often.   Hypertension: Chronic. Patient is taking Lisinopril-HCTZ 20-12.5mg  BID and Metoprolol Succinate 100mg  daily. She monitors her blood pressures at home. Ranging: 117-135/70-80s. Denies CP, SHOB, dizziness, lightheadedness, or lower extremity edema. HA present, but not as often as previously. She is concerned about angioedema with Lisinopril and had a dry cough previously.   Patient reports she has abd cramps. Started years ago. Occurs usually when eating. Tried Benefiber and Miralax. Had colonoscopy in 2021. Afterwards, the abd cramping improved. She stopped taking medication, but it has returned about 2-3 times a week. Also, reports she has problems with constipation. Reasoning for taking Miralax.    Outpatient Encounter Medications as of 12/20/2023  Medication Sig   atorvastatin (LIPITOR) 80 MG tablet Take 80 mg by mouth daily.   hydrochlorothiazide  (MICROZIDE ) 12.5 MG capsule Take 1 capsule (12.5 mg total) by mouth daily.   metoprolol succinate (TOPROL-XL) 100 MG 24 hr tablet Take 100 mg by mouth daily.   olmesartan  (BENICAR ) 40 MG tablet Take 1 tablet (40 mg total) by mouth daily.   [DISCONTINUED] atorvastatin  (LIPITOR) 20 MG tablet Take 20 mg by mouth daily.   [DISCONTINUED] clindamycin-benzoyl peroxide (BENZACLIN) gel Apply 1 application topically every morning.   [DISCONTINUED] ibuprofen (ADVIL) 800 MG tablet Take 800 mg by mouth 3 (three) times daily.   [DISCONTINUED] lisinopril-hydrochlorothiazide  (ZESTORETIC) 20-12.5 MG tablet Take 1 tablet by mouth 2 (two) times daily.   [DISCONTINUED] albuterol (VENTOLIN HFA) 108 (90 Base) MCG/ACT inhaler Inhale 2 puffs into the lungs every 4 (four) hours as needed.   [DISCONTINUED] azelastine (OPTIVAR) 0.05 % ophthalmic solution Apply to eye.   [DISCONTINUED] benzonatate (TESSALON) 100 MG capsule benzonatate 100 mg capsule   [DISCONTINUED] erythromycin ophthalmic ointment erythromycin 5 mg/gram (0.5 %) eye ointment  APPLY 1/2 INCH IN LEFT EYE EVERY 12 HOURS   [DISCONTINUED] spironolactone (ALDACTONE) 100 MG tablet Take 200 mg by mouth daily.   [DISCONTINUED] TWYNEO 0.1-3 % CREA Apply topically.   No facility-administered encounter medications on file as of 12/20/2023.    Past Medical History:  Diagnosis Date   Chronic headaches    Hyperlipidemia    Hypertension     Past Surgical History:  Procedure Laterality Date   PARTIAL HYSTERECTOMY     REDUCTION MAMMAPLASTY Bilateral 03/2022    Family History  Problem Relation Age of Onset   High Cholesterol Mother    Hypertension Mother    Diabetes Father    High Cholesterol Father    Hypertension Father    Hypertension Sister    Cancer Sister    Healthy Brother    Healthy Brother    Ovarian cancer Paternal Grandmother    Diabetes  Sister    High Cholesterol Sister    Hypertension Sister    Breast cancer Neg Hx     Social History   Socioeconomic History   Marital status: Legally Separated    Spouse name: Not on file   Number of children: Not on file   Years of education: Not on file   Highest education level: Not on file  Occupational History   Not on file  Tobacco Use   Smoking status:  Never   Smokeless tobacco: Never  Substance and Sexual Activity   Alcohol use: No   Drug use: Not on file   Sexual activity: Not on file  Other Topics Concern   Not on file  Social History Narrative   Not on file   Social Drivers of Health   Financial Resource Strain: Patient Declined (12/11/2022)   Received from [facility]   Overall Financial Resource Strain (CARDIA)    Difficulty of Paying Living Expenses: Patient declined  Food Insecurity: Patient Declined (12/20/2023)   Hunger Vital Sign    Worried About Running Out of Food in the Last Year: Patient declined    Ran Out of Food in the Last Year: Patient declined  Transportation Needs: Patient Declined (12/11/2022)   Received from [facility]   PRAPARE - Transportation    In the past 12 months, has lack of transportation kept you from medical appointments or from getting medications?: Patient declined    Lack of Transportation (Non-Medical): Patient declined  Physical Activity: Insufficiently Active (12/20/2023)   Exercise Vital Sign    Days of Exercise per Week: 3 days    Minutes of Exercise per Session: 40 min  Stress: No Stress Concern Present (12/20/2023)   [employer] of Occupational Health - Occupational Stress Questionnaire    Feeling of Stress: Only a little  Social Connections: Moderately Integrated (12/20/2023)   Social Connection and Isolation Panel    Frequency of Communication with Friends and Family: More than three times a week    Frequency of Social Gatherings with Friends and Family: Twice a week    Attends Religious Services: More than 4 times per year    Active Member of [employer] or Organizations: No    Attends Banker Meetings: Never    Marital Status: Married  Catering manager Violence: Patient Declined (12/20/2023)   Humiliation, Afraid, Rape, and Kick questionnaire    Fear of Current or Ex-Partner: Patient declined    Emotionally Abused: Patient declined     Physically Abused: Patient declined    Sexually Abused: Patient declined    ROS See HPI above    Objective  BP 134/86   Pulse 60   Temp 98.5 F (36.9 C) (Oral)   Ht 5' 5.5 (1.664 m)   Wt 222 lb (100.7 kg)   SpO2 98%   BMI 36.38 kg/m   Physical Exam Vitals reviewed.  Constitutional:      General: She is not in acute distress.    Appearance: Normal appearance. She is obese. She is not ill-appearing, toxic-appearing or diaphoretic.  HENT:     Head: Normocephalic and atraumatic.  Eyes:     General:        Right eye: No discharge.        Left eye: No discharge.     Conjunctiva/sclera: Conjunctivae normal.  Cardiovascular:     Rate and Rhythm: Normal rate and regular rhythm.     Heart sounds: Normal heart sounds. No  murmur heard.    No friction rub. No gallop.  Pulmonary:     Effort: Pulmonary effort is normal. No respiratory distress.     Breath sounds: Normal breath sounds.  Musculoskeletal:        General: Normal range of motion.  Skin:    General: Skin is warm and dry.  Neurological:     General: No focal deficit present.     Mental Status: She is alert and oriented to person, place, and time. Mental status is at baseline.  Psychiatric:        Mood and Affect: Mood normal.        Behavior: Behavior normal.        Thought Content: Thought content normal.        Judgment: Judgment normal.      Assessment & Plan:  Hypertension, unspecified type Assessment & Plan: Blood pressure is stable today. However, through shared decision making decided to change Lisinopril-Hydrochlorothiazide  20-12.5mg  twice a day due to concerns of side effects: dry cough and angioedema. Changed to Olmesartan  40mg  tablet daily and Hydrochlorothiazide  12.5mg  daily. Continue with Metoprolol 100mg  daily. Recommend to monitor blood pressure twice a day and bring in reading to next appointment. Ordered CMP to assess kidney function.   Orders: -     hydroCHLOROthiazide ; Take 1 capsule (12.5 mg  total) by mouth daily.  Dispense: 30 capsule; Refill: 0 -     Olmesartan  Medoxomil; Take 1 tablet (40 mg total) by mouth daily.  Dispense: 30 tablet; Refill: 0 -     Comprehensive metabolic panel with GFR  Hyperlipidemia, unspecified hyperlipidemia type Assessment & Plan: Continue Atorvastatin 80mg  daily. Not convinced her muscle cramps during the night is related to taking a statin. Also, she has tried not taking the medication with having the same symptoms. Ordered CMP and lipid panel. Patient is fasting.   Orders: -     Comprehensive metabolic panel with GFR -     Lipid panel  Muscle cramps at night -     CBC with Differential/Platelet -     Magnesium -     Comprehensive metabolic panel with GFR -     Iron -     Ferritin  Abdominal cramping  History of anemia -     CBC with Differential/Platelet -     Iron -     Ferritin  Class 2 severe obesity due to excess calories with serious comorbidity and body mass index (BMI) of 36.0 to 36.9 in adult (HCC) -     CBC with Differential/Platelet -     Comprehensive metabolic panel with GFR -     Hemoglobin A1c -     Lipid panel -     TSH  Encounter for screening for HIV -     HIV Antibody (routine testing w rflx)  Need for hepatitis C screening test -     Hepatitis C antibody  Encounter to establish care  1.Review health maintenance:  -Colonoscopy: 09/22/2019 with 10 year- [facility]  -Covid booster: Declines  -Hep B vaccine: May have had  -Hep C and HIV: Ordered  -Influenza vaccine: Decline  -PNA vaccine: Decline  -Zoster vaccine: Hold until next visit  2.Ordered labs (CBC, Magnesium, CMP, Iron/Ferritin, A1c, TSH) based on reason for muscle cramps, history of anemia, and BMI-obesity. Office will call with results and will be available on MyChart.  3.Recommend to add Benefiber back into diet daily and take Miralax as needed for constipation. Will reassess  cramps with this change in 2 weeks.  Return in about 2  weeks (around 01/03/2024) for follow-up.   Ronin Crager, NP

## 2023-12-20 NOTE — Assessment & Plan Note (Signed)
 Blood pressure is stable today. However, through shared decision making decided to change Lisinopril-Hydrochlorothiazide  20-12.5mg  twice a day due to concerns of side effects: dry cough and angioedema. Changed to Olmesartan  40mg  tablet daily and Hydrochlorothiazide  12.5mg  daily. Continue with Metoprolol 100mg  daily. Recommend to monitor blood pressure twice a day and bring in reading to next appointment. Ordered CMP to assess kidney function.

## 2023-12-20 NOTE — Patient Instructions (Addendum)
-  It was nice to meet you and look forward to taking care of you.  -Blood pressure is stable today. However, through shared decision making decided to change Lisinopril-Hydrochlorothiazide  20-12.5mg  twice a day due to concerns of side effects: dry cough and angioedema. Changed to Olmesartan  40mg  tablet daily and Hydrochlorothiazide  12.5mg  daily. Continue with Metoprolol 100mg  daily. Recommend to monitor blood pressure twice a day and bring in reading to next appointment.  -Continue with Atorvastatin. -Ordered labs (CBC, Magnesium, CMP, Iron/Ferritin, A1c, TSH) based on reason for muscle cramps, history of anemia, and BMI-obesity. Office will call with results and will be available on MyChart.  -Recommend to add Benefiber back into diet daily and take Miralax as needed for constipation. Will reassess cramps with this change in 2 weeks.  -Follow up in 2 weeks .

## 2023-12-20 NOTE — Assessment & Plan Note (Signed)
 Continue Atorvastatin 80mg  daily. Not convinced her muscle cramps during the night is related to taking a statin. Also, she has tried not taking the medication with having the same symptoms. Ordered CMP and lipid panel. Patient is fasting.

## 2023-12-21 LAB — COMPREHENSIVE METABOLIC PANEL WITH GFR
ALT: 35 IU/L — ABNORMAL HIGH (ref 0–32)
AST: 27 IU/L (ref 0–40)
Albumin: 4 g/dL (ref 3.9–4.9)
Alkaline Phosphatase: 293 IU/L — ABNORMAL HIGH (ref 44–121)
BUN/Creatinine Ratio: 15 (ref 9–23)
BUN: 15 mg/dL (ref 6–24)
Bilirubin Total: 0.6 mg/dL (ref 0.0–1.2)
CO2: 23 mmol/L (ref 20–29)
Calcium: 9.9 mg/dL (ref 8.7–10.2)
Chloride: 101 mmol/L (ref 96–106)
Creatinine, Ser: 1.03 mg/dL — ABNORMAL HIGH (ref 0.57–1.00)
Globulin, Total: 4 g/dL (ref 1.5–4.5)
Glucose: 97 mg/dL (ref 70–99)
Potassium: 4 mmol/L (ref 3.5–5.2)
Sodium: 140 mmol/L (ref 134–144)
Total Protein: 8 g/dL (ref 6.0–8.5)
eGFR: 66 mL/min/1.73 (ref >59)

## 2023-12-21 LAB — MAGNESIUM: Magnesium: 2.2 mg/dL (ref 1.6–2.3)

## 2023-12-21 LAB — CBC WITH DIFFERENTIAL/PLATELET
Basophils Absolute: 0.1 x10E3/uL (ref 0.0–0.2)
Basos: 1 %
EOS (ABSOLUTE): 0.2 x10E3/uL (ref 0.0–0.4)
Eos: 2 %
Hematocrit: 47.5 % — ABNORMAL HIGH (ref 34.0–46.6)
Hemoglobin: 14.3 g/dL (ref 11.1–15.9)
Immature Grans (Abs): 0 x10E3/uL (ref 0.0–0.1)
Immature Granulocytes: 0 %
Lymphocytes Absolute: 2.7 x10E3/uL (ref 0.7–3.1)
Lymphs: 29 %
MCH: 23.6 pg — ABNORMAL LOW (ref 26.6–33.0)
MCHC: 30.1 g/dL — ABNORMAL LOW (ref 31.5–35.7)
MCV: 78 fL — ABNORMAL LOW (ref 79–97)
Monocytes Absolute: 0.5 x10E3/uL (ref 0.1–0.9)
Monocytes: 5 %
Neutrophils Absolute: 6.1 x10E3/uL (ref 1.4–7.0)
Neutrophils: 63 %
Platelets: 244 x10E3/uL (ref 150–450)
RBC: 6.07 x10E6/uL — ABNORMAL HIGH (ref 3.77–5.28)
RDW: 17.1 % — ABNORMAL HIGH (ref 11.7–15.4)
WBC: 9.5 x10E3/uL (ref 3.4–10.8)

## 2023-12-21 LAB — LIPID PANEL
Chol/HDL Ratio: 3.9 ratio (ref 0.0–4.4)
Cholesterol, Total: 177 mg/dL (ref 100–199)
HDL: 45 mg/dL (ref >39)
LDL Chol Calc (NIH): 120 mg/dL — ABNORMAL HIGH (ref 0–99)
Triglycerides: 63 mg/dL (ref 0–149)
VLDL Cholesterol Cal: 12 mg/dL (ref 5–40)

## 2023-12-21 LAB — IRON: Iron: 60 ug/dL (ref 27–159)

## 2023-12-21 LAB — HEMOGLOBIN A1C
Est. average glucose Bld gHb Est-mCnc: 154 mg/dL
Hgb A1c MFr Bld: 7 % — ABNORMAL HIGH (ref 4.8–5.6)

## 2023-12-21 LAB — HEPATITIS C ANTIBODY: Hep C Virus Ab: NONREACTIVE

## 2023-12-21 LAB — TSH: TSH: 1.66 u[IU]/mL (ref 0.450–4.500)

## 2023-12-21 LAB — FERRITIN: Ferritin: 125 ng/mL (ref 15–150)

## 2023-12-21 LAB — HIV ANTIBODY (ROUTINE TESTING W REFLEX): HIV Screen 4th Generation wRfx: NONREACTIVE

## 2023-12-22 ENCOUNTER — Ambulatory Visit: Payer: Self-pay | Admitting: Family Medicine

## 2023-12-22 DIAGNOSIS — E785 Hyperlipidemia, unspecified: Secondary | ICD-10-CM

## 2023-12-24 MED ORDER — EZETIMIBE 10 MG PO TABS: 10.0000 mg | ORAL_TABLET | Freq: Every day | ORAL | 0 refills | Status: DC

## 2024-01-03 ENCOUNTER — Ambulatory Visit: Admitting: Family Medicine

## 2024-01-06 ENCOUNTER — Ambulatory Visit
Admission: RE | Admit: 2024-01-06 | Discharge: 2024-01-06 | Disposition: A | Discharge status: Home / Self Care | Source: Ambulatory Visit | Attending: Obstetrics and Gynecology | Admitting: Obstetrics and Gynecology

## 2024-01-06 ENCOUNTER — Other Ambulatory Visit: Payer: Self-pay | Admitting: Family Medicine

## 2024-01-06 DIAGNOSIS — Z1231 Encounter for screening mammogram for malignant neoplasm of breast: Secondary | ICD-10-CM | POA: Insufficient documentation

## 2024-01-17 ENCOUNTER — Encounter: Payer: Self-pay | Admitting: Family Medicine

## 2024-01-17 ENCOUNTER — Ambulatory Visit: Admitting: Family Medicine

## 2024-01-17 VITALS — BP 130/86 | HR 65 | Temp 97.8°F | Ht 65.5 in | Wt 224.0 lb

## 2024-01-17 DIAGNOSIS — N289 Disorder of kidney and ureter, unspecified: Secondary | ICD-10-CM

## 2024-01-17 DIAGNOSIS — R109 Unspecified abdominal pain: Secondary | ICD-10-CM

## 2024-01-17 DIAGNOSIS — I10 Essential (primary) hypertension: Secondary | ICD-10-CM | POA: Diagnosis not present

## 2024-01-17 DIAGNOSIS — Z23 Encounter for immunization: Secondary | ICD-10-CM | POA: Diagnosis not present

## 2024-01-17 DIAGNOSIS — E119 Type 2 diabetes mellitus without complications: Secondary | ICD-10-CM

## 2024-01-17 DIAGNOSIS — E785 Hyperlipidemia, unspecified: Secondary | ICD-10-CM | POA: Diagnosis not present

## 2024-01-17 MED ORDER — HYDROCHLOROTHIAZIDE 12.5 MG PO CAPS: 25.0000 mg | ORAL_CAPSULE | Freq: Every day | ORAL | Status: DC

## 2024-01-17 NOTE — Progress Notes (Signed)
 Established Patient Office Visit   Subjective:  Patient ID: Anne Harvey, female    DOB: 04/12/1976  Age: 48 y.o. MRN: [id number]  Chief Complaint  Patient presents with   Medical Management of Chronic Issues    2 week follow up     HPI HTN: Blood pressure is stable on last visit.  However, through shared decision making decided to change Lisinopril-Hydrochlorothiazide  20-12.5mg  twice a day due to concerns of side effects: dry cough and angioedema. Changed to Olmesartan  40mg  tablet daily and Hydrochlorothiazide  12.5mg  daily. Continue with Metoprolol 100mg  daily. Recommend to monitor blood pressure twice a day. Range of blood pressures 114-175/62-111. Blood pressure a little more controlled in the last few days.   On last appointment, patient reports she had abd cramps. Started years ago. Occurs usually when eating. Tried Benefiber and Miralax. Had colonoscopy in 2021. Afterwards, the abd cramping improved. She stopped taking medication, but it has returned about 2-3 times a week. Also, reports she has problems with constipation. Reasoning for taking Miralax. Recommend to add Benefiber back into diet and take Miralax PRN. She has not tried adding Benefiber and Miralax. Denies abd pain.   Also, on recent labs, her A1c was elevated to 7.0 Not taking any medication. Back in July 2022, A1c was 6.6, and did one session with nutrition and didn't start medication.  ROS See HPI above     Objective:   BP 130/86   Pulse 65   Temp 97.8 F (36.6 C) (Oral)   Ht 5' 5.5 (1.664 m)   Wt 224 lb (101.6 kg)   SpO2 94%   BMI 36.71 kg/m     Physical Exam Vitals reviewed.  Constitutional:      General: She is not in acute distress.    Appearance: Normal appearance. She is not ill-appearing, toxic-appearing or diaphoretic.  Eyes:     General:        Right eye: No discharge.        Left eye: No discharge.     Conjunctiva/sclera: Conjunctivae normal.  Cardiovascular:     Rate and Rhythm:  Normal rate and regular rhythm.     Pulses:          Dorsalis pedis pulses are 2+ on the right side and 2+ on the left side.       Posterior tibial pulses are 2+ on the left side.     Heart sounds: Normal heart sounds. No murmur heard.    No friction rub. No gallop.  Pulmonary:     Effort: Pulmonary effort is normal. No respiratory distress.     Breath sounds: Normal breath sounds.  Musculoskeletal:        General: Normal range of motion.  Feet:     Right foot:     Protective Sensation: 10 sites tested.  10 sites sensed.     Skin integrity: Skin integrity normal.     Toenail Condition: Right toenails are normal.     Left foot:     Protective Sensation: 10 sites tested.  10 sites sensed.     Skin integrity: Skin integrity normal.     Toenail Condition: Left toenails are normal.  Skin:    General: Skin is warm and dry.  Neurological:     General: No focal deficit present.     Mental Status: She is alert and oriented to person, place, and time. Mental status is at baseline.  Psychiatric:        Mood  and Affect: Mood normal.        Behavior: Behavior normal.        Thought Content: Thought content normal.        Judgment: Judgment normal.    The 10-year ASCVD risk score (Arnett DK, et al., 2019) is: 10.1%    Assessment & Plan:  Function kidney decreased -     Comprehensive metabolic panel with GFR  Hypertension, unspecified type Assessment & Plan: Blood pressure is still elevated. Continue Metoprolol 100mg  and Olmesartan  40mg  daily. Increase HCTZ to 25mg  from 12.5mg . Continue to monitor blood pressure and follow up in 2 weeks via telehealth. Ordered CMP to assess kidney function. Kidney function had decreased some last time.   Orders: -     hydroCHLOROthiazide ; Take 2 capsules (25 mg total) by mouth daily.  Hyperlipidemia, unspecified hyperlipidemia type  Abdominal cramping  Type 2 diabetes mellitus without complication, without long-term current use of insulin (HCC) -      Microalbumin / creatinine urine ratio  Immunization due -     Varicella-zoster vaccine IM  -Discussed about being diabetic and high cholesterol. Patient would like to do lifestyle changes and declines to start medication. Strongly encourage to work on lifestyle changes of diet, decreasing carbohydrates and sweets. Regular exercise at least 150 minutes a week.  -Offered nutrition referral,  but she is not interested in referral.  -The American Diabetic Association website is great resource diabetes.   Return in about 3 months (around 04/18/2024) for  2 week telehealth , physical.   Mirl Hillery, NP

## 2024-01-17 NOTE — Patient Instructions (Addendum)
-  It was good to see you. -First Zoster (Shingles) vaccine provided will need to obtain second on at physical in 3 months.  -Blood pressure is still elevated. Continue Metoprolol 100mg  and Olmesartan  40mg  daily. Increase Hydrochlorothiazide  to 25mg  from 12.5mg  daily. Continue to monitor blood pressure and follow up in 2 weeks via telehealth.  -Ordered lab to reassess kidney function. Office will call with results and will be available via MyChart.  -Abdominal cramping has improved. Continue to monitor and follow up if needed.  -Discussed about being diabetic and high cholesterol. Strongly encourage to work on lifestyle changes of diet, decreasing carbohydrates and sweets. Regular exercise at least 150 minutes a week.  -If you change your mind about nutrition referral, please send a MyChart message. -The American Diabetic Association website is great resource.  -Follow up in 3 months for physical and 2 week telehealth on blood pressure.

## 2024-01-17 NOTE — Assessment & Plan Note (Signed)
 Blood pressure is still elevated. Continue Metoprolol 100mg  and Olmesartan  40mg  daily. Increase HCTZ to 25mg  from 12.5mg . Continue to monitor blood pressure and follow up in 2 weeks via telehealth. Ordered CMP to assess kidney function. Kidney function had decreased some last time.

## 2024-01-18 LAB — MICROALBUMIN / CREATININE URINE RATIO
Creatinine, Urine: 52 mg/dL (ref 20–275)
Microalb Creat Ratio: 121 mg/g{creat} — ABNORMAL HIGH (ref <30)
Microalb, Ur: 6.3 mg/dL

## 2024-01-18 LAB — COMPREHENSIVE METABOLIC PANEL WITH GFR
AG Ratio: 1.1 (calc) (ref 1.0–2.5)
ALT: 51 U/L — ABNORMAL HIGH (ref 6–29)
AST: 30 U/L (ref 10–35)
Albumin: 4 g/dL (ref 3.6–5.1)
Alkaline phosphatase (APISO): 206 U/L — ABNORMAL HIGH (ref 37–153)
BUN/Creatinine Ratio: 16 (calc) (ref 6–22)
BUN: 18 mg/dL (ref 7–25)
CO2: 27 mmol/L (ref 20–32)
Calcium: 9.5 mg/dL (ref 8.6–10.4)
Chloride: 101 mmol/L (ref 98–110)
Creat: 1.1 mg/dL — ABNORMAL HIGH (ref 0.50–1.03)
Globulin: 3.6 g/dL (ref 1.9–3.7)
Glucose, Bld: 77 mg/dL (ref 65–99)
Potassium: 3.8 mmol/L (ref 3.5–5.3)
Sodium: 138 mmol/L (ref 135–146)
Total Bilirubin: 0.5 mg/dL (ref 0.2–1.2)
Total Protein: 7.6 g/dL (ref 6.1–8.1)
eGFR: 61 mL/min/1.73m2 (ref >=60)

## 2024-01-21 ENCOUNTER — Other Ambulatory Visit: Payer: Self-pay | Admitting: Family Medicine

## 2024-01-21 ENCOUNTER — Ambulatory Visit: Payer: Self-pay | Admitting: Family Medicine

## 2024-01-21 ENCOUNTER — Other Ambulatory Visit: Payer: Self-pay

## 2024-01-21 DIAGNOSIS — I10 Essential (primary) hypertension: Secondary | ICD-10-CM

## 2024-01-31 ENCOUNTER — Encounter: Payer: Self-pay | Admitting: Family Medicine

## 2024-01-31 ENCOUNTER — Ambulatory Visit: Admitting: Family Medicine

## 2024-01-31 ENCOUNTER — Telehealth: Admitting: Family Medicine

## 2024-01-31 VITALS — BP 118/75 | HR 61 | Ht 65.0 in | Wt 224.0 lb

## 2024-01-31 DIAGNOSIS — N289 Disorder of kidney and ureter, unspecified: Secondary | ICD-10-CM | POA: Diagnosis not present

## 2024-01-31 DIAGNOSIS — I10 Essential (primary) hypertension: Secondary | ICD-10-CM | POA: Diagnosis not present

## 2024-01-31 MED ORDER — HYDROCHLOROTHIAZIDE 12.5 MG PO CAPS: 12.5000 mg | ORAL_CAPSULE | Freq: Every day | ORAL | 1 refills | Status: DC

## 2024-01-31 NOTE — Progress Notes (Signed)
 Virtual Visit via Video Note  I connected with Anne Harvey on 01/31/24 at 4:19 PM by a video enabled telemedicine application and verified that I am speaking with the correct person using two identifiers.   Patient Location: Home Provider Location: office - [place].    I discussed the limitations, risks, security and privacy concerns of performing an evaluation and management service by telephone and the availability of in person appointments. I also discussed with the patient that there may be a patient responsible charge related to this service. The patient expressed understanding and agreed to proceed, consent obtained  Chief Complaint  Patient presents with   Medical Management of Chronic Issues    2 week follow up     History of Present Illness: Anne Harvey is a 48 y.o. female following up on blood pressure. She is prescribed Metoprolol 100mg , Olmesartan  40mg , and HCTZ 25mg  daily. On last appointment, increased HCTZ to 25mg  from 12.5mg . However, she only took one day worth of HCTZ 25mg  since she didn't receive a refill. She reports the following BPs: 129/93, 131/81,143/92,122/84, 120/82, 114/93, and 127/68. She reports she has more BPs below 130/80 than over.   BP Readings from Last 3 Encounters:  01/31/24 118/75  01/17/24 130/86  12/20/23 134/86    Patient Active Problem List   Diagnosis Date Noted   Chronic headaches    Osteoarthritis of knee 02/24/2021   Elevated hemoglobin A1c 03/31/2019   Hyperlipidemia 03/31/2019   Epistaxis, recurrent 08/14/2016   Hypertension 11/15/2014   Obesity, Class I, BMI 30-34.9 11/15/2014   Past Medical History:  Diagnosis Date   Chronic headaches    Hyperlipidemia    Hypertension    Past Surgical History:  Procedure Laterality Date   PARTIAL HYSTERECTOMY     REDUCTION MAMMAPLASTY Bilateral 03/2022   Allergies  Allergen Reactions   Morphine And Codeine Rash   Prior to Admission medications   Medication Sig Start  Date End Date Taking? Authorizing Provider  atorvastatin (LIPITOR) 80 MG tablet Take 80 mg by mouth daily.   Yes [provider]  ezetimibe  (ZETIA ) 10 MG tablet Take 1 tablet (10 mg total) by mouth daily. 12/24/23  Yes Billy Philippe SAUNDERS, NP  hydrochlorothiazide  (MICROZIDE ) 12.5 MG capsule Take 2 capsules (25 mg total) by mouth daily. 01/17/24 02/16/24 Yes Billy Philippe SAUNDERS, NP  metoprolol succinate (TOPROL-XL) 100 MG 24 hr tablet TAKE 1 TABLET(100 MG) BY MOUTH DAILY 01/06/24  Yes Billy Philippe SAUNDERS, NP  olmesartan  (BENICAR ) 40 MG tablet TAKE 1 TABLET(40 MG) BY MOUTH DAILY 01/21/24  Yes Billy Philippe SAUNDERS, NP   Social History   Socioeconomic History   Marital status: Legally Separated    Spouse name: Not on file   Number of children: Not on file   Years of education: Not on file   Highest education level: Not on file  Occupational History   Not on file  Tobacco Use   Smoking status: Never   Smokeless tobacco: Never  Substance and Sexual Activity   Alcohol use: No   Drug use: Not on file   Sexual activity: Not on file  Other Topics Concern   Not on file  Social History Narrative   Not on file   Social Drivers of Health   Financial Resource Strain: Patient Declined (12/11/2022)   Received from [facility]   Overall Financial Resource Strain (CARDIA)    Difficulty of Paying Living Expenses: Patient declined  Food Insecurity: Patient Declined (12/20/2023)  Hunger Vital Sign    Worried About Running Out of Food in the Last Year: Patient declined    Ran Out of Food in the Last Year: Patient declined  Transportation Needs: Patient Declined (12/11/2022)   Received from [facility] - Transportation    In the past 12 months, has lack of transportation kept you from medical appointments or from getting medications?: Patient declined    Lack of Transportation (Non-Medical): Patient declined  Physical Activity: Insufficiently  Active (12/20/2023)   Exercise Vital Sign    Days of Exercise per Week: 3 days    Minutes of Exercise per Session: 40 min  Stress: No Stress Concern Present (12/20/2023)   [employer] of Occupational Health - Occupational Stress Questionnaire    Feeling of Stress: Only a little  Social Connections: Moderately Integrated (12/20/2023)   Social Connection and Isolation Panel    Frequency of Communication with Friends and Family: More than three times a week    Frequency of Social Gatherings with Friends and Family: Twice a week    Attends Religious Services: More than 4 times per year    Active Member of [employer] or Organizations: No    Attends Banker Meetings: Never    Marital Status: Married  Catering manager Violence: Patient Declined (12/20/2023)   Humiliation, Afraid, Rape, and Kick questionnaire    Fear of Current or Ex-Partner: Patient declined    Emotionally Abused: Patient declined    Physically Abused: Patient declined    Sexually Abused: Patient declined    Observations/Objective: Today's Vitals   01/31/24 1448  BP: 118/75  Pulse: 61  SpO2: 99%  Weight: 224 lb (101.6 kg)  Height: 5' 5 (1.651 m)   Physical Exam Vitals reviewed.  Constitutional:      General: She is not in acute distress.    Appearance: Normal appearance. She is obese. She is not ill-appearing, toxic-appearing or diaphoretic.  HENT:     Head: Normocephalic and atraumatic.  Eyes:     General:        Right eye: No discharge.        Left eye: No discharge.     Conjunctiva/sclera: Conjunctivae normal.  Cardiovascular:     Rate and Rhythm: Normal rate and regular rhythm.     Heart sounds: Normal heart sounds. No murmur heard.    No friction rub. No gallop.  Pulmonary:     Effort: Pulmonary effort is normal. No respiratory distress.     Breath sounds: Normal breath sounds.  Musculoskeletal:        General: Normal range of motion.  Skin:    General: Skin is warm and dry.  Neurological:      General: No focal deficit present.     Mental Status: She is alert and oriented to person, place, and time. Mental status is at baseline.  Psychiatric:        Mood and Affect: Mood normal.        Behavior: Behavior normal.        Thought Content: Thought content normal.        Judgment: Judgment normal.     Assessment and Plan: Function kidney decreased -     Comprehensive metabolic panel with GFR  Hypertension, unspecified type -     hydroCHLOROthiazide ; Take 1 capsule (12.5 mg total) by mouth daily.  Dispense: 90 capsule; Refill: 1  -Through shared decision making, decided to continue with Metoprolol 100mg , Olmesartan  40mg , and  Hydrochlorothiazide  12.5mg  daily. Refilled Hydrochlorothiazide . Continue to closely monitor blood pressure. If it starts consistently being above 130/80, will need to increase Hydrochlorothiazide . -Ordered a CMP to assess kidney function to be drawn at a [facility] location. Office will call with lab results and will be available on MyChart.  -Continue to work on lifestyle modifications of diet and exercise for diabetes, hypertension, and hyperlipidemia. Patient does not want medication management for diabetes and elevated microalbumin/creatinine. She would like to try lifestyle changes for 3 months.   Follow Up Instructions: As scheduled in January.    I discussed the assessment and treatment plan with the patient. The patient was provided an opportunity to ask questions and all were answered. The patient agreed with the plan and demonstrated an understanding of the instructions.   The patient was advised to call back or seek an in-person evaluation if the symptoms worsen or if the condition fails to improve as anticipated.  Shaneta Cervenka, NP

## 2024-01-31 NOTE — Patient Instructions (Addendum)
-  It was great to see you today. -Through shared decision making, decided to continue with Metoprolol 100mg , Olmesartan  40mg , and Hydrochlorothiazide  12.5mg  daily. Refilled Hydrochlorothiazide . Continue to closely monitor blood pressure. If it starts consistently being above 130/80, will need to increase Hydrochlorothiazide . -Ordered a CMP to assess kidney function to be drawn at a [facility] location. Office will call with lab results and will be available on MyChart.  -Continue to work on lifestyle modifications of diet and exercise for diabetes, hypertension, and hyperlipidemia.  -Follow up as scheduled in January.

## 2024-02-04 ENCOUNTER — Ambulatory Visit: Payer: Self-pay | Admitting: Family Medicine

## 2024-02-04 DIAGNOSIS — R748 Abnormal levels of other serum enzymes: Secondary | ICD-10-CM

## 2024-02-04 DIAGNOSIS — N289 Disorder of kidney and ureter, unspecified: Secondary | ICD-10-CM

## 2024-02-04 LAB — COMPREHENSIVE METABOLIC PANEL WITH GFR
ALT: 56 IU/L — ABNORMAL HIGH (ref 0–32)
AST: 36 IU/L (ref 0–40)
Albumin: 4.1 g/dL (ref 3.9–4.9)
Alkaline Phosphatase: 208 IU/L — ABNORMAL HIGH (ref 41–116)
BUN/Creatinine Ratio: 17 (ref 9–23)
BUN: 22 mg/dL (ref 6–24)
Bilirubin Total: 0.6 mg/dL (ref 0.0–1.2)
CO2: 23 mmol/L (ref 20–29)
Calcium: 9.8 mg/dL (ref 8.7–10.2)
Chloride: 102 mmol/L (ref 96–106)
Creatinine, Ser: 1.27 mg/dL — ABNORMAL HIGH (ref 0.57–1.00)
Globulin, Total: 3.5 g/dL (ref 1.5–4.5)
Glucose: 80 mg/dL (ref 70–99)
Potassium: 4 mmol/L (ref 3.5–5.2)
Sodium: 139 mmol/L (ref 134–144)
Total Protein: 7.6 g/dL (ref 6.0–8.5)
eGFR: 52 mL/min/1.73 — ABNORMAL LOW (ref >59)

## 2024-02-10 ENCOUNTER — Other Ambulatory Visit: Payer: Self-pay | Admitting: Nephrology

## 2024-02-10 DIAGNOSIS — N1831 Chronic kidney disease, stage 3a: Secondary | ICD-10-CM

## 2024-02-10 DIAGNOSIS — R809 Proteinuria, unspecified: Secondary | ICD-10-CM

## 2024-02-13 ENCOUNTER — Ambulatory Visit
Admission: RE | Admit: 2024-02-13 | Discharge: 2024-02-13 | Disposition: A | Discharge status: Home / Self Care | Source: Ambulatory Visit | Attending: Nephrology | Admitting: Nephrology

## 2024-02-13 ENCOUNTER — Ambulatory Visit
Admission: RE | Admit: 2024-02-13 | Discharge: 2024-02-13 | Disposition: A | Discharge status: Home / Self Care | Source: Ambulatory Visit | Attending: Family Medicine | Admitting: Family Medicine

## 2024-02-13 DIAGNOSIS — R748 Abnormal levels of other serum enzymes: Secondary | ICD-10-CM

## 2024-02-13 DIAGNOSIS — R809 Proteinuria, unspecified: Secondary | ICD-10-CM

## 2024-02-13 DIAGNOSIS — N1831 Chronic kidney disease, stage 3a: Secondary | ICD-10-CM

## 2024-02-16 ENCOUNTER — Ambulatory Visit: Payer: Self-pay | Admitting: Family Medicine

## 2024-03-16 LAB — OPHTHALMOLOGY REPORT-SCANNED

## 2024-03-21 ENCOUNTER — Other Ambulatory Visit: Payer: Self-pay | Admitting: Family Medicine

## 2024-03-21 DIAGNOSIS — E785 Hyperlipidemia, unspecified: Secondary | ICD-10-CM

## 2024-03-30 ENCOUNTER — Telehealth

## 2024-03-30 DIAGNOSIS — M79676 Pain in unspecified toe(s): Secondary | ICD-10-CM

## 2024-03-31 ENCOUNTER — Encounter: Payer: Self-pay | Admitting: Ophthalmology

## 2024-03-31 NOTE — Progress Notes (Signed)
°  Because of need for proper examination and potential lab testing (we cannot do via e-visit) to make sure this gets proper acute and ongoing management, I feel your condition warrants further evaluation and I recommend that you be seen in a face-to-face visit.   NOTE: There will be NO CHARGE for this E-Visit   If you are having a true medical emergency, please call 911.     For an urgent face to face visit, [place] [facility] has multiple urgent care centers for your convenience.  Click the link below for the full list of locations and hours, walk-in wait times, appointment scheduling options and driving directions:  Urgent Care - [place], [place], [place], [place], [place], [place]  [place]     Your MyChart E-visit questionnaire answers were reviewed by a board certified advanced clinical practitioner to complete your personal care plan based on your specific symptoms.    Thank you for using e-Visits.

## 2024-04-02 ENCOUNTER — Encounter: Payer: Self-pay | Admitting: Ophthalmology

## 2024-04-02 NOTE — Anesthesia Preprocedure Evaluation (Addendum)
 Anesthesia Evaluation    Airway        Dental   Pulmonary           Cardiovascular hypertension,      Neuro/Psych    GI/Hepatic   Endo/Other  diabetes    Renal/GU      Musculoskeletal   Abdominal   Peds  Hematology   Anesthesia Other Findings Medical History  Hyperlipidemia Hypertension Chronic headaches Tachycardia Stage 3a chronic kidney disease (CKD) (HCC) Type 2 diabetes mellitus with chronic kidney disease (HCC) Elevated liver enzymes     Reproductive/Obstetrics                              Anesthesia Physical Anesthesia Plan  ASA: 2  Anesthesia Plan: MAC   Post-op Pain Management:    Induction: Intravenous  PONV Risk Score and Plan:   Airway Management Planned: Natural Airway and Nasal Cannula  Additional Equipment:   Intra-op Plan:   Post-operative Plan:   Informed Consent: I have reviewed the patients History and Physical, chart, labs and discussed the procedure including the risks, benefits and alternatives for the proposed anesthesia with the patient or authorized representative who has indicated his/her understanding and acceptance.     Dental Advisory Given  Plan Discussed with: Anesthesiologist, CRNA and Surgeon  Anesthesia Plan Comments: (Patient consented for risks of anesthesia including but not limited to:  - adverse reactions to medications - damage to eyes, teeth, lips or other oral mucosa - nerve damage due to positioning  - sore throat or hoarseness - Damage to heart, brain, nerves, lungs, other parts of body or loss of life  Patient voiced understanding and assent.)        Anesthesia Quick Evaluation

## 2024-04-02 NOTE — Discharge Instructions (Signed)

## 2024-04-07 ENCOUNTER — Other Ambulatory Visit: Payer: Self-pay

## 2024-04-07 ENCOUNTER — Ambulatory Visit: Payer: Self-pay | Admitting: Anesthesiology

## 2024-04-07 ENCOUNTER — Ambulatory Visit
Admission: RE | Admit: 2024-04-07 | Discharge: 2024-04-07 | Disposition: A | Discharge status: Home / Self Care | Attending: Ophthalmology | Admitting: Ophthalmology

## 2024-04-07 ENCOUNTER — Encounter: Payer: Self-pay | Admitting: Ophthalmology

## 2024-04-07 ENCOUNTER — Encounter: Payer: Self-pay | Admitting: Anesthesiology

## 2024-04-07 ENCOUNTER — Encounter: Admission: RE | Payer: Self-pay

## 2024-04-07 DIAGNOSIS — H2512 Age-related nuclear cataract, left eye: Secondary | ICD-10-CM | POA: Diagnosis present

## 2024-04-07 DIAGNOSIS — E1136 Type 2 diabetes mellitus with diabetic cataract: Secondary | ICD-10-CM | POA: Diagnosis not present

## 2024-04-07 DIAGNOSIS — I129 Hypertensive chronic kidney disease with stage 1 through stage 4 chronic kidney disease, or unspecified chronic kidney disease: Secondary | ICD-10-CM | POA: Diagnosis not present

## 2024-04-07 DIAGNOSIS — E1122 Type 2 diabetes mellitus with diabetic chronic kidney disease: Secondary | ICD-10-CM | POA: Insufficient documentation

## 2024-04-07 DIAGNOSIS — N1831 Chronic kidney disease, stage 3a: Secondary | ICD-10-CM | POA: Diagnosis not present

## 2024-04-07 HISTORY — DX: Chronic kidney disease, stage 3a: N18.31

## 2024-04-07 HISTORY — DX: Tachycardia, unspecified: R00.0

## 2024-04-07 HISTORY — DX: Abnormal levels of other serum enzymes: R74.8

## 2024-04-07 HISTORY — DX: Type 2 diabetes mellitus with diabetic chronic kidney disease: E11.22

## 2024-04-07 HISTORY — PX: CATARACT EXTRACTION W/PHACO: SHX586

## 2024-04-07 SURGERY — PHACOEMULSIFICATION, CATARACT, WITH IOL INSERTION
Anesthesia: Monitor Anesthesia Care | Site: Eye | Laterality: Left

## 2024-04-07 MED ORDER — FENTANYL CITRATE (PF) 100 MCG/2ML IJ SOLN
INTRAMUSCULAR | Status: DC | PRN
  Administered 2024-04-07: 50 ug via INTRAVENOUS

## 2024-04-07 MED ORDER — CYCLOPENTOLATE HCL 2 % OP SOLN
OPHTHALMIC | Status: AC
  Filled 2024-04-07: qty 2

## 2024-04-07 MED ORDER — MIDAZOLAM HCL 2 MG/2ML IJ SOLN
INTRAMUSCULAR | Status: AC
  Filled 2024-04-07: qty 2

## 2024-04-07 MED ORDER — MIDAZOLAM HCL (PF) 2 MG/2ML IJ SOLN
INTRAMUSCULAR | Status: DC | PRN
  Administered 2024-04-07: 2 mg via INTRAVENOUS

## 2024-04-07 MED ORDER — SIGHTPATH DOSE#1 BSS IO SOLN
INTRAOCULAR | Status: DC | PRN
  Administered 2024-04-07: 15 mL via INTRAOCULAR

## 2024-04-07 MED ORDER — CYCLOPENTOLATE HCL 2 % OP SOLN
1.0000 [drp] | OPHTHALMIC | Status: AC | PRN
  Administered 2024-04-07 (×3): 1 [drp] via OPHTHALMIC

## 2024-04-07 MED ORDER — MOXIFLOXACIN HCL 0.5 % OP SOLN
OPHTHALMIC | Status: DC | PRN
  Administered 2024-04-07: .2 mL via OPHTHALMIC

## 2024-04-07 MED ORDER — TETRACAINE HCL 0.5 % OP SOLN
OPHTHALMIC | Status: AC
  Filled 2024-04-07: qty 4

## 2024-04-07 MED ORDER — SIGHTPATH DOSE#1 NA CHONDROIT SULF-NA HYALURON 40-17 MG/ML IO SOLN
INTRAOCULAR | Status: DC | PRN
  Administered 2024-04-07: 1 mL via INTRAOCULAR

## 2024-04-07 MED ORDER — BRIMONIDINE TARTRATE-TIMOLOL 0.2-0.5 % OP SOLN
OPHTHALMIC | Status: DC | PRN
  Administered 2024-04-07: 1 [drp] via OPHTHALMIC

## 2024-04-07 MED ORDER — FENTANYL CITRATE (PF) 100 MCG/2ML IJ SOLN
INTRAMUSCULAR | Status: AC
  Filled 2024-04-07: qty 2

## 2024-04-07 MED ORDER — PHENYLEPHRINE HCL 10 % OP SOLN
1.0000 [drp] | OPHTHALMIC | Status: AC | PRN
  Administered 2024-04-07 (×3): 1 [drp] via OPHTHALMIC

## 2024-04-07 MED ORDER — LIDOCAINE HCL (PF) 2 % IJ SOLN
INTRAOCULAR | Status: DC | PRN
  Administered 2024-04-07: 2 mL

## 2024-04-07 MED ORDER — SIGHTPATH DOSE#1 BSS IO SOLN
INTRAOCULAR | Status: DC | PRN
  Administered 2024-04-07: 54 mL via OPHTHALMIC

## 2024-04-07 MED ORDER — LACTATED RINGERS IV SOLN: INTRAVENOUS | Status: DC

## 2024-04-07 MED ORDER — PHENYLEPHRINE HCL 10 % OP SOLN
OPHTHALMIC | Status: AC
  Filled 2024-04-07: qty 5

## 2024-04-07 MED ORDER — TETRACAINE HCL 0.5 % OP SOLN
1.0000 [drp] | OPHTHALMIC | Status: DC | PRN
  Administered 2024-04-07 (×3): 1 [drp] via OPHTHALMIC

## 2024-04-07 SURGICAL SUPPLY — 10 items
CANNULA ANT/CHMB 27G (MISCELLANEOUS) ×1 IMPLANT
CYSTOTOME ANGL RVRS SHRT 25G (CUTTER) ×1 IMPLANT
FEE CATARACT SUITE SIGHTPATH (MISCELLANEOUS) ×1 IMPLANT
GLOVE BIOGEL PI IND STRL 8 (GLOVE) ×1 IMPLANT
GLOVE SURG LX STRL 8.0 MICRO (GLOVE) ×1 IMPLANT
GLOVE SURG SYN 6.5 PF PI BL (GLOVE) ×1 IMPLANT
LENS IOL TECNIS EYHANCE 13.0 (Intraocular Lens) IMPLANT
NDL FILTER BLUNT 18X1 1/2 (NEEDLE) ×1 IMPLANT
NEEDLE FILTER BLUNT 18X1 1/2 (NEEDLE) ×1 IMPLANT
SYR 3ML LL SCALE MARK (SYRINGE) ×1 IMPLANT

## 2024-04-07 NOTE — H&P (Signed)
 [facility]   Primary Care Physician:  Billy Philippe SAUNDERS, NP Ophthalmologist: Dr. Elsie Carmine  Pre-Procedure History & Physical: HPI:  Anne Harvey is a 48 y.o. female here for cataract surgery.   Past Medical History:  Diagnosis Date   Chronic headaches    Elevated liver enzymes    Hyperlipidemia    Hypertension    Stage 3a chronic kidney disease (CKD) (HCC)    Tachycardia    Type 2 diabetes mellitus with chronic kidney disease (HCC)     Past Surgical History:  Procedure Laterality Date   PARTIAL HYSTERECTOMY     REDUCTION MAMMAPLASTY Bilateral 03/2022    Prior to Admission medications  Medication Sig Start Date End Date Taking? Authorizing Provider  atorvastatin (LIPITOR) 80 MG tablet Take 80 mg by mouth daily.   Yes [provider]  ezetimibe  (ZETIA ) 10 MG tablet TAKE 1 TABLET(10 MG) BY MOUTH DAILY 03/23/24  Yes Williamson, Joanna R, NP  hydrochlorothiazide  (MICROZIDE ) 12.5 MG capsule Take 1 capsule (12.5 mg total) by mouth daily. 01/31/24 04/30/24 Yes Billy Philippe SAUNDERS, NP  indomethacin (INDOCIN) 50 MG capsule Take 50 mg by mouth 3 (three) times daily with meals.   Yes [provider]  metoprolol succinate (TOPROL-XL) 100 MG 24 hr tablet TAKE 1 TABLET(100 MG) BY MOUTH DAILY 01/06/24  Yes Billy Philippe SAUNDERS, NP  olmesartan  (BENICAR ) 40 MG tablet TAKE 1 TABLET(40 MG) BY MOUTH DAILY 01/21/24  Yes Billy Philippe SAUNDERS, NP    Allergies as of 03/20/2024 - Review Complete 01/31/2024  Allergen Reaction Noted   Morphine and codeine Rash 07/17/2016    Family History  Problem Relation Age of Onset   High Cholesterol Mother    Hypertension Mother    Diabetes Father    High Cholesterol Father    Hypertension Father    Hypertension Sister    Cancer Sister    Healthy Brother    Healthy Brother    Ovarian cancer Paternal Grandmother    Diabetes Sister    High Cholesterol Sister    Hypertension Sister    Breast cancer Neg Hx      Social History   Socioeconomic History   Marital status: Legally Separated    Spouse name: Not on file   Number of children: Not on file   Years of education: Not on file   Highest education level: Not on file  Occupational History   Not on file  Tobacco Use   Smoking status: Never   Smokeless tobacco: Never  Substance and Sexual Activity   Alcohol use: No   Drug use: Not on file   Sexual activity: Not on file  Other Topics Concern   Not on file  Social History Narrative   Not on file   Social Drivers of Health   Tobacco Use: Low Risk (04/07/2024)   Patient History    Smoking Tobacco Use: Never    Smokeless Tobacco Use: Never    Passive Exposure: Not on file  Financial Resource Strain: Patient Declined (12/11/2022)   Received from [facility]   Overall Financial Resource Strain (CARDIA)    Difficulty of Paying Living Expenses: Patient declined  Food Insecurity: Patient Declined (12/20/2023)   Epic    Worried About Programme Researcher, Broadcasting/film/video in the Last Year: Patient declined    Barista in the Last Year: Patient declined  Transportation Needs: Patient Declined (12/11/2022)   Received from [facility]   PRAPARE -  Transportation    In the past 12 months, has lack of transportation kept you from medical appointments or from getting medications?: Patient declined    Lack of Transportation (Non-Medical): Patient declined  Physical Activity: Insufficiently Active (12/20/2023)   Exercise Vital Sign    Days of Exercise per Week: 3 days    Minutes of Exercise per Session: 40 min  Stress: No Stress Concern Present (12/20/2023)   [employer] of Occupational Health - Occupational Stress Questionnaire    Feeling of Stress: Only a little  Social Connections: Moderately Integrated (12/20/2023)   Social Connection and Isolation Panel    Frequency of Communication with Friends and Family: More than three times a week    Frequency of Social  Gatherings with Friends and Family: Twice a week    Attends Religious Services: More than 4 times per year    Active Member of Clubs or Organizations: No    Attends Banker Meetings: Never    Marital Status: Married  Catering Manager Violence: Patient Declined (12/20/2023)   Epic    Fear of Current or Ex-Partner: Patient declined    Emotionally Abused: Patient declined    Physically Abused: Patient declined    Sexually Abused: Patient declined  Depression (PHQ2-9): Low Risk (01/31/2024)   Depression (PHQ2-9)    PHQ-2 Score: 0  Alcohol Screen: Low Risk (12/20/2023)   Alcohol Screen    Last Alcohol Screening Score (AUDIT): 0  Housing: Patient Declined (12/11/2022)   Received from [facility]   Epic    In the last 12 months, was there a time when you were not able to pay the mortgage or rent on time?: Patient declined    Number of Times Moved in the Last Year: Not on file    At any time in the past 12 months, were you homeless or living in a shelter (including now)?: Patient declined  Utilities: Patient Declined (12/11/2022)   Received from [facility] Utilities    Threatened with loss of utilities: Patient declined  Health Literacy: Adequate Health Literacy (12/20/2023)   B1300 Health Literacy    Frequency of need for help with medical instructions: Never    Review of Systems: See HPI, otherwise negative ROS  Physical Exam: BP (!) 169/105   Temp 97.7 F (36.5 C) (Temporal)   Resp 12   Ht 5' 5 (1.651 m)   Wt 99.7 kg   SpO2 98%   BMI 36.59 kg/m  General:   Alert, cooperative. Head:  Normocephalic and atraumatic. Respiratory:  Normal work of breathing. Cardiovascular:  NAD  Impression/Plan: Anne Harvey is here for cataract surgery.  Risks, benefits, limitations, and alternatives regarding cataract surgery have been reviewed with the patient.  Questions have been answered.  All parties agreeable.   Elsie Carmine, MD  04/07/2024, 8:23 AM

## 2024-04-07 NOTE — Transfer of Care (Signed)
 Immediate Anesthesia Transfer of Care Note  Patient: Anne Harvey  Procedure(s) Performed: PHACOEMULSIFICATION, CATARACT, WITH IOL INSERTION 5.57 00:37.7 (Left: Eye)  Patient Location: PACU  Anesthesia Type: MAC  Level of Consciousness: awake, alert  and patient cooperative  Airway and Oxygen Therapy: Patient Spontanous Breathing and Patient connected to supplemental oxygen  Post-op Assessment: Post-op Vital signs reviewed, Patient's Cardiovascular Status Stable, Respiratory Function Stable, Patent Airway and No signs of Nausea or vomiting  Post-op Vital Signs: Reviewed and stable  Complications: No notable events documented.

## 2024-04-07 NOTE — Op Note (Signed)
 PREOPERATIVE DIAGNOSIS:  Nuclear sclerotic cataract of the left eye.   POSTOPERATIVE DIAGNOSIS:  Nuclear sclerotic cataract of the left eye.   OPERATIVE PROCEDURE:ORPROCALL@   SURGEON:  Elsie Carmine, MD.   ANESTHESIA:  Anesthesiologist: Ola Donny BROCKS, MD CRNA: Myra Lawless, CRNA  1.      Managed anesthesia care. 2.     0.57ml of Shugarcaine was instilled following the paracentesis   COMPLICATIONS:  None.   TECHNIQUE:   Stop and chop   DESCRIPTION OF PROCEDURE:  The patient was examined and consented in the preoperative holding area where the aforementioned topical anesthesia was applied to the left eye and then brought back to the Operating Room where the left eye was prepped and draped in the usual sterile ophthalmic fashion and a lid speculum was placed. A paracentesis was created with the side port blade and the anterior chamber was filled with viscoelastic. A near clear corneal incision was performed with the steel keratome. A continuous curvilinear capsulorrhexis was performed with a cystotome followed by the capsulorrhexis forceps. Hydrodissection and hydrodelineation were carried out with BSS on a blunt cannula. The lens was removed in a stop and chop  technique and the remaining cortical material was removed with the irrigation-aspiration handpiece. The capsular bag was inflated with viscoelastic and the intraocular lens was placed in the capsular bag without complication. The remaining viscoelastic was removed from the eye with the irrigation-aspiration handpiece. The wounds were hydrated. The anterior chamber was flushed with BSS and the eye was inflated to physiologic pressure. 0.44ml Vigamox  was placed in the anterior chamber. The wounds were found to be water tight. The eye was dressed with Combigan . The patient was given protective glasses to wear throughout the day and a shield with which to sleep tonight. The patient was also given drops with which to begin a drop regimen  today and will follow-up with me in one day. Implant Name Type Inv. Item Serial No. Manufacturer Lot No. LRB No. Used Action  LENS IOL TECNIS EYHANCE 13.0 - [id number] Intraocular Lens LENS IOL TECNIS EYHANCE 13.0 [id number] [employer]  Left 1 Implanted    Procedures: PHACOEMULSIFICATION, CATARACT, WITH IOL INSERTION 5.57 00:37.7 (Left)  Electronically signed: Elsie Carmine 04/07/2024 8:46 AM

## 2024-04-07 NOTE — Anesthesia Postprocedure Evaluation (Signed)
"   Anesthesia Post Note  Patient: Anne Harvey  Procedure(s) Performed: PHACOEMULSIFICATION, CATARACT, WITH IOL INSERTION 5.57 00:37.7 (Left: Eye)  Patient location during evaluation: PACU Anesthesia Type: MAC Level of consciousness: awake and alert Pain management: pain level controlled Vital Signs Assessment: post-procedure vital signs reviewed and stable Respiratory status: spontaneous breathing, nonlabored ventilation, respiratory function stable and patient connected to nasal cannula oxygen Cardiovascular status: stable and blood pressure returned to baseline Postop Assessment: no apparent nausea or vomiting Anesthetic complications: no   No notable events documented.   Last Vitals:  Vitals:   04/07/24 0854 04/07/24 0855  BP: (!) 178/100 (!) 178/107  Pulse: (!) 59 (!) 47  Resp: 14 10  Temp: 36.6 C   SpO2: 99% 96%    Last Pain:  Vitals:   04/07/24 0854  TempSrc:   PainSc: 0-No pain                 Jahmier Willadsen C Deriyah Kunath      "

## 2024-04-10 ENCOUNTER — Other Ambulatory Visit: Payer: Self-pay

## 2024-04-10 ENCOUNTER — Encounter: Payer: Self-pay | Admitting: Ophthalmology

## 2024-04-13 ENCOUNTER — Inpatient Hospital Stay

## 2024-04-13 ENCOUNTER — Ambulatory Visit
Admission: RE | Admit: 2024-04-13 | Discharge: 2024-04-13 | Disposition: A | Source: Ambulatory Visit | Attending: Internal Medicine | Admitting: Internal Medicine

## 2024-04-13 ENCOUNTER — Ambulatory Visit
Admission: RE | Admit: 2024-04-13 | Discharge: 2024-04-13 | Disposition: A | Discharge status: Home / Self Care | Source: Ambulatory Visit | Attending: Internal Medicine | Admitting: Internal Medicine

## 2024-04-13 ENCOUNTER — Encounter: Payer: Self-pay | Admitting: Anesthesiology

## 2024-04-13 ENCOUNTER — Inpatient Hospital Stay: Attending: Internal Medicine | Admitting: Internal Medicine

## 2024-04-13 ENCOUNTER — Encounter: Payer: Self-pay | Admitting: Internal Medicine

## 2024-04-13 VITALS — BP 131/87 | HR 71 | Temp 96.9°F | Resp 16 | Ht 65.0 in | Wt 219.5 lb

## 2024-04-13 DIAGNOSIS — E1122 Type 2 diabetes mellitus with diabetic chronic kidney disease: Secondary | ICD-10-CM | POA: Insufficient documentation

## 2024-04-13 DIAGNOSIS — D472 Monoclonal gammopathy: Secondary | ICD-10-CM | POA: Insufficient documentation

## 2024-04-13 DIAGNOSIS — N1831 Chronic kidney disease, stage 3a: Secondary | ICD-10-CM | POA: Insufficient documentation

## 2024-04-13 DIAGNOSIS — I129 Hypertensive chronic kidney disease with stage 1 through stage 4 chronic kidney disease, or unspecified chronic kidney disease: Secondary | ICD-10-CM | POA: Diagnosis not present

## 2024-04-13 NOTE — Assessment & Plan Note (Addendum)
#   DEC 2025-  [M protein= 0.5 gm/dl]; Hb 13; GFR-54 [Dr.Lateef]. .  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   # MGUS-long discussion with the patient regarding natural history of MGUS; small risk of progression to multiple myeloma. Patient is less likely at this time patient has any active myeloma-although given renal insufficiency [see discussion below]. Check MM panel; cbc/cmp; K/l light chains; / iron studies; ferritin; X-rays.  CRP;  . Discussed the role of bone marrow biopsy for further characterization-risk stratification etc.  However I think is reasonable to hold off any further invasive procedure at this time-unless there is a rapid progression of disease.   # Chronic kidney disease-stage-III [DEC 2025- US - kidney-] ; followed by /nephrology, Dr.Lateef.     Thank you Dr.Lateef MD- for allowing me to participate in the care of your pleasant patient. Please do not hesitate to contact me with questions or concerns in the interim.  # DISPOSITION: # labs today-PLEASE ORDER [facility] -MM panel; cbc/cmp; K/l light chains; / iron studies; ferritin; X-rays  # follow up in 2-3 weeks- MD; no labs- dr.B  # 45 minutes face-to-face with the patient discussing the above plan of care; more than 50% of time spent on prognosis/ natural history; counseling and coordination.

## 2024-04-13 NOTE — Progress Notes (Signed)
No questions at this time

## 2024-04-13 NOTE — Progress Notes (Signed)
 [facility] CONSULT NOTE  Patient Care Team: Billy Philippe SAUNDERS, NP as PCP - General (Family Medicine) Rennie Cindy SAUNDERS, MD as Consulting Physician (Oncology)  CHIEF COMPLAINTS/PURPOSE OF CONSULTATION: Monoclonal gammopathy  HEMATOLOGY HISTORY  # CKD stage- [Dr.]  HISTORY OF PRESENTING ILLNESS:  Anne Harvey 48 y.o.  female has been referred to us  for further evaluation/work-up for monoclonal gammopathy.  48 y.o. female who is following up today for diabetes mellitus type 2 with chronic kidney disease, chronic kidney disease stage IIIa, proteinuria, MGUS, and hypertension. At the last visit we initiated serologic workup. She was found to have abnormal SPEP with M spike of 0.5 g/dL. We talked about hematology referral for this today. In regards to renal ultrasound there was bilateral increased echogenicity noted. Most recent eGFR found to be 51. Patient also has hypertension with a blood pressure of 110/82. Patient denies nausea, vomiting,   Patient noted to have elevated M protein as part of workup of chronic kidney disease by nephrology.   Review of Systems  Constitutional:  Negative for chills, diaphoresis, fever, malaise/fatigue and weight loss.  HENT:  Negative for nosebleeds and sore throat.   Eyes:  Negative for double vision.  Respiratory:  Negative for cough, hemoptysis, sputum production, shortness of breath and wheezing.   Cardiovascular:  Negative for chest pain, palpitations, orthopnea and leg swelling.  Gastrointestinal:  Negative for abdominal pain, blood in stool, constipation, diarrhea, heartburn, melena, nausea and vomiting.  Genitourinary:  Negative for dysuria, frequency and urgency.  Musculoskeletal:  Negative for back pain and joint pain.  Skin: Negative.  Negative for itching and rash.  Neurological:  Negative for dizziness, tingling, focal weakness, weakness and headaches.  Endo/Heme/Allergies:  Does not bruise/bleed easily.   Psychiatric/Behavioral:  Negative for depression. The patient is not nervous/anxious and does not have insomnia.     MEDICAL HISTORY:  Past Medical History:  Diagnosis Date   Chronic headaches    Elevated liver enzymes    Hyperlipidemia    Hypertension    Stage 3a chronic kidney disease (CKD) (HCC)    Tachycardia    Type 2 diabetes mellitus with chronic kidney disease (HCC)     SURGICAL HISTORY: Past Surgical History:  Procedure Laterality Date   CATARACT EXTRACTION W/PHACO Left 04/07/2024   Procedure: PHACOEMULSIFICATION, CATARACT, WITH IOL INSERTION 5.57 00:37.7;  Surgeon: Jaye Fallow, MD;  Location: [facility];  Service: Ophthalmology;  Laterality: Left;   PARTIAL HYSTERECTOMY     REDUCTION MAMMAPLASTY Bilateral 03/2022    SOCIAL HISTORY: Social History   Socioeconomic History   Marital status: Legally Separated    Spouse name: Not on file   Number of children: Not on file   Years of education: Not on file   Highest education level: Not on file  Occupational History   Not on file  Tobacco Use   Smoking status: Never   Smokeless tobacco: Never  Substance and Sexual Activity   Alcohol use: No   Drug use: Not on file   Sexual activity: Not on file  Other Topics Concern   Not on file  Social History Narrative   Not on file   Social Drivers of Health   Tobacco Use: Low Risk (04/13/2024)   Patient History    Smoking Tobacco Use: Never    Smokeless Tobacco Use: Never    Passive Exposure: Not on file  Financial Resource Strain: Patient Declined (12/11/2022)   Received from [facility]   Overall  Financial Resource Strain (CARDIA)    Difficulty of Paying Living Expenses: Patient declined  Food Insecurity: No Food Insecurity (04/13/2024)   Epic    Worried About Programme Researcher, Broadcasting/film/video in the Last Year: Never true    Ran Out of Food in the Last Year: Never true  Transportation Needs: No Transportation Needs (04/13/2024)   Epic     Lack of Transportation (Medical): No    Lack of Transportation (Non-Medical): No  Physical Activity: Insufficiently Active (12/20/2023)   Exercise Vital Sign    Days of Exercise per Week: 3 days    Minutes of Exercise per Session: 40 min  Stress: No Stress Concern Present (12/20/2023)   [employer] of Occupational Health - Occupational Stress Questionnaire    Feeling of Stress: Only a little  Social Connections: Moderately Integrated (12/20/2023)   Social Connection and Isolation Panel    Frequency of Communication with Friends and Family: More than three times a week    Frequency of Social Gatherings with Friends and Family: Twice a week    Attends Religious Services: More than 4 times per year    Active Member of Clubs or Organizations: No    Attends Banker Meetings: Never    Marital Status: Married  Catering Manager Violence: Not At Risk (04/13/2024)   Epic    Fear of Current or Ex-Partner: No    Emotionally Abused: No    Physically Abused: No    Sexually Abused: No  Depression (PHQ2-9): Low Risk (04/13/2024)   Depression (PHQ2-9)    PHQ-2 Score: 0  Alcohol Screen: Low Risk (12/20/2023)   Alcohol Screen    Last Alcohol Screening Score (AUDIT): 0  Housing: Low Risk (04/13/2024)   Epic    Unable to Pay for Housing in the Last Year: No    Number of Times Moved in the Last Year: 0    Homeless in the Last Year: No  Utilities: Not At Risk (04/13/2024)   Epic    Threatened with loss of utilities: No  Health Literacy: Adequate Health Literacy (12/20/2023)   B1300 Health Literacy    Frequency of need for help with medical instructions: Never    FAMILY HISTORY: Family History  Problem Relation Age of Onset   High Cholesterol Mother    Hypertension Mother    Diabetes Father    High Cholesterol Father    Hypertension Father    Hypertension Sister    Cancer Sister    Healthy Brother    Healthy Brother    Ovarian cancer Paternal Grandmother    Diabetes Sister     High Cholesterol Sister    Hypertension Sister    Breast cancer Neg Hx     ALLERGIES:  is allergic to morphine and codeine.  MEDICATIONS:  Current Outpatient Medications  Medication Sig Dispense Refill   atorvastatin (LIPITOR) 80 MG tablet Take 80 mg by mouth daily.     ezetimibe  (ZETIA ) 10 MG tablet TAKE 1 TABLET(10 MG) BY MOUTH DAILY 90 tablet 0   hydrochlorothiazide  (MICROZIDE ) 12.5 MG capsule Take 1 capsule (12.5 mg total) by mouth daily. 90 capsule 1   indomethacin (INDOCIN) 50 MG capsule Take 50 mg by mouth 3 (three) times daily with meals.     metoprolol succinate (TOPROL-XL) 100 MG 24 hr tablet TAKE 1 TABLET(100 MG) BY MOUTH DAILY 90 tablet 1   olmesartan  (BENICAR ) 40 MG tablet TAKE 1 TABLET(40 MG) BY MOUTH DAILY 90 tablet 1   No current facility-administered  medications for this visit.      PHYSICAL EXAMINATION:   Vitals:   04/13/24 1410  BP: 131/87  Pulse: 71  Resp: 16  Temp: (!) 96.9 F (36.1 C)  SpO2: 97%   Filed Weights   04/13/24 1410  Weight: 219 lb 8 oz (99.6 kg)    Physical Exam Vitals and nursing note reviewed.  HENT:     Head: Normocephalic and atraumatic.     Mouth/Throat:     Pharynx: Oropharynx is clear.  Eyes:     Extraocular Movements: Extraocular movements intact.     Pupils: Pupils are equal, round, and reactive to light.  Cardiovascular:     Rate and Rhythm: Normal rate and regular rhythm.  Pulmonary:     Comments: Decreased breath sounds bilaterally.  Abdominal:     Palpations: Abdomen is soft.  Musculoskeletal:        General: Normal range of motion.     Cervical back: Normal range of motion.  Skin:    General: Skin is warm.  Neurological:     General: No focal deficit present.     Mental Status: She is alert and oriented to person, place, and time.  Psychiatric:        Behavior: Behavior normal.        Judgment: Judgment normal.     LABORATORY DATA:  I have reviewed the data as listed Lab Results  Component Value  Date   WBC 9.5 12/20/2023   HGB 14.3 12/20/2023   HCT 47.5 (H) 12/20/2023   MCV 78 (L) 12/20/2023   PLT 244 12/20/2023   Recent Labs    12/20/23 1054 01/17/24 1505 02/03/24 1351  NA 140 138 139  K 4.0 3.8 4.0  CL 101 101 102  CO2 23 27 23   GLUCOSE 97 77 80  BUN 15 18 22   CREATININE 1.03* 1.10* 1.27*  CALCIUM 9.9 9.5 9.8  PROT 8.0 7.6 7.6  ALBUMIN 4.0  --  4.1  AST 27 30 36  ALT 35* 51* 56*  ALKPHOS 293*  --  208*  BILITOT 0.6 0.5 0.6     No results found.  No results found for: KPAFRELGTCHN, LAMBDASER, KAPLAMBRATIO   Monoclonal gammopathy # DEC 2025-  [M protein= 0.5 gm/dl]; Hb 13; GFR-54 [Dr.Lateef]. .  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   # MGUS-long discussion with the patient regarding natural history of MGUS; small risk of progression to multiple myeloma. Patient is less likely at this time patient has any active myeloma-although given renal insufficiency [see discussion below]. Check MM panel; cbc/cmp; K/l light chains; / iron studies; ferritin; X-rays.  CRP;  . Discussed the role of bone marrow biopsy for further characterization-risk stratification etc.  However I think is reasonable to hold off any further invasive procedure at this time-unless there is a rapid progression of disease.   # Chronic kidney disease-stage-III [DEC 2025- US - kidney-] ; followed by /nephrology, Dr.Lateef.     Thank you Dr.Lateef MD- for allowing me to participate in the care of your pleasant patient. Please do not hesitate to contact me with questions or concerns in the interim.  # DISPOSITION: # labs today-PLEASE ORDER [facility] -MM panel; cbc/cmp; K/l light chains; / iron studies; ferritin; X-rays  # follow up in 2-3 weeks- MD; no labs- dr.B  # 45 minutes face-to-face with the patient discussing the above plan of care; more than 50% of time spent on prognosis/ natural history; counseling and coordination.  All questions  were answered. The patient knows to call the clinic with any problems, questions or concerns.      Cindy JONELLE Joe, MD 04/13/2024 3:44 PM

## 2024-04-17 ENCOUNTER — Telehealth: Payer: Self-pay | Admitting: *Deleted

## 2024-04-17 NOTE — Telephone Encounter (Signed)
 Copied from CRM [id number]. Topic: Clinical - Request for Lab/Test Order >> Apr 17, 2024  3:42 PM Thersia BROCKS wrote: Reason for CRM: Patient called in regarding needing lab orders done before the appointment on 01/09 . Patient stated she had a virtual appointment of webmd for gout but has to have a  blood sample sent in so need that as well to be discuss on the appointment 01/09

## 2024-04-20 ENCOUNTER — Other Ambulatory Visit: Payer: Self-pay

## 2024-04-20 DIAGNOSIS — E119 Type 2 diabetes mellitus without complications: Secondary | ICD-10-CM

## 2024-04-20 DIAGNOSIS — R7989 Other specified abnormal findings of blood chemistry: Secondary | ICD-10-CM

## 2024-04-20 DIAGNOSIS — M109 Gout, unspecified: Secondary | ICD-10-CM

## 2024-04-20 DIAGNOSIS — E785 Hyperlipidemia, unspecified: Secondary | ICD-10-CM

## 2024-04-20 NOTE — Telephone Encounter (Signed)
 Labs ordered and pt notfied

## 2024-04-21 ENCOUNTER — Ambulatory Visit: Admission: RE | Admit: 2024-04-21 | Payer: Self-pay | Source: Home / Self Care | Admitting: Ophthalmology

## 2024-04-22 LAB — LIPID PANEL
Chol/HDL Ratio: 3.2 ratio (ref 0.0–4.4)
Cholesterol, Total: 147 mg/dL (ref 100–199)
HDL: 46 mg/dL
LDL Chol Calc (NIH): 88 mg/dL (ref 0–99)
Triglycerides: 65 mg/dL (ref 0–149)
VLDL Cholesterol Cal: 13 mg/dL (ref 5–40)

## 2024-04-22 LAB — CBC WITH DIFFERENTIAL/PLATELET
Basophils Absolute: 0.1 x10E3/uL (ref 0.0–0.2)
Basos: 1 %
EOS (ABSOLUTE): 0.2 x10E3/uL (ref 0.0–0.4)
Eos: 2 %
Hematocrit: 45.6 % (ref 34.0–46.6)
Hemoglobin: 14.3 g/dL (ref 11.1–15.9)
Immature Grans (Abs): 0 x10E3/uL (ref 0.0–0.1)
Immature Granulocytes: 0 %
Lymphocytes Absolute: 3.9 x10E3/uL — ABNORMAL HIGH (ref 0.7–3.1)
Lymphs: 36 %
MCH: 24.4 pg — ABNORMAL LOW (ref 26.6–33.0)
MCHC: 31.4 g/dL — ABNORMAL LOW (ref 31.5–35.7)
MCV: 78 fL — ABNORMAL LOW (ref 79–97)
Monocytes Absolute: 0.6 x10E3/uL (ref 0.1–0.9)
Monocytes: 6 %
Neutrophils Absolute: 6.2 x10E3/uL (ref 1.4–7.0)
Neutrophils: 55 %
Platelets: 261 x10E3/uL (ref 150–450)
RBC: 5.86 x10E6/uL — ABNORMAL HIGH (ref 3.77–5.28)
RDW: 15.4 % (ref 11.7–15.4)
WBC: 10.9 x10E3/uL — ABNORMAL HIGH (ref 3.4–10.8)

## 2024-04-22 LAB — MICROALBUMIN / CREATININE URINE RATIO
Creatinine, Urine: 16.3 mg/dL
Microalb/Creat Ratio: 101 mg/g{creat} — ABNORMAL HIGH (ref 0–29)
Microalbumin, Urine: 16.4 ug/mL

## 2024-04-22 LAB — HEMOGLOBIN A1C
Est. average glucose Bld gHb Est-mCnc: 143 mg/dL
Hgb A1c MFr Bld: 6.6 % — ABNORMAL HIGH (ref 4.8–5.6)

## 2024-04-22 LAB — URIC ACID: Uric Acid: 6.6 mg/dL — ABNORMAL HIGH (ref 2.6–6.2)

## 2024-04-24 ENCOUNTER — Ambulatory Visit (INDEPENDENT_AMBULATORY_CARE_PROVIDER_SITE_OTHER): Admitting: Family Medicine

## 2024-04-24 ENCOUNTER — Encounter: Payer: Self-pay | Admitting: Family Medicine

## 2024-04-24 ENCOUNTER — Ambulatory Visit: Payer: Self-pay | Admitting: Family Medicine

## 2024-04-24 VITALS — BP 124/76 | HR 82 | Temp 98.5°F | Ht 65.0 in | Wt 220.0 lb

## 2024-04-24 DIAGNOSIS — R748 Abnormal levels of other serum enzymes: Secondary | ICD-10-CM | POA: Diagnosis not present

## 2024-04-24 DIAGNOSIS — M10371 Gout due to renal impairment, right ankle and foot: Secondary | ICD-10-CM

## 2024-04-24 DIAGNOSIS — E785 Hyperlipidemia, unspecified: Secondary | ICD-10-CM | POA: Diagnosis not present

## 2024-04-24 DIAGNOSIS — Z0000 Encounter for general adult medical examination without abnormal findings: Secondary | ICD-10-CM | POA: Diagnosis not present

## 2024-04-24 DIAGNOSIS — I10 Essential (primary) hypertension: Secondary | ICD-10-CM

## 2024-04-24 MED ORDER — ATORVASTATIN CALCIUM 80 MG PO TABS: 80.0000 mg | ORAL_TABLET | Freq: Every day | ORAL | 1 refills | Status: AC

## 2024-04-24 MED ORDER — EZETIMIBE 10 MG PO TABS: 10.0000 mg | ORAL_TABLET | Freq: Every day | ORAL | 1 refills | Status: AC

## 2024-04-24 MED ORDER — COLCHICINE 0.6 MG PO CAPS: ORAL_CAPSULE | ORAL | 2 refills | Status: AC

## 2024-04-24 MED ORDER — OLMESARTAN MEDOXOMIL-HCTZ 40-12.5 MG PO TABS: 1.0000 | ORAL_TABLET | Freq: Every day | ORAL | 1 refills | Status: AC

## 2024-04-24 NOTE — Patient Instructions (Addendum)
-  It was great to see you today. You got this! We got this!  -Physical exam completed today.  -Discussed throughly about lab results from earlier this week.  *Decreased kidney function-recommend to stay hydrated with water 64-100oz a day; call back to [facility] to see if a follow up appointment is needed; will request from previous visits *Increased liver enzymes-will try to add on hepatitis panel from labs obtain earlier this week, if not, will need a redraw; suspect this is more related to fatty liver that was seen on right upper quad ultrasound back on 02/13/2024. Discussed about possible referral to hepatologist, will wait on hepatitis panel results and try to work on lifestyle changes *A1c is still diabetic range with no medications. Still not in agreement with starting Ozempic for diabetes and fatty liver. Through shared decision making, decided to strongly work on lifestyle changes of a healthy diet and exercise. If not improved in 3 months, will start Ozempic.  *Uric acid is elevated which signifies gout. Provided some general information about gout. Prescribed Colchicine  for gout flare up.  -Continue with all medications. *Refilled Atorvastatin  and Ezetimibe .  *Changed blood pressure medication into a combination since blood pressure is stable. Olmesartan -Hydrochlorothiazide  40-12.5mg  tablet daily.  -May take Tylenol Arthritis for left knee pain, no more than 3000mg /24 hr.  -Follow up in 3 months for chronic management. Message about a week before visit and will order labs to have them collected with [facility].

## 2024-04-24 NOTE — Progress Notes (Signed)
 "  Complete physical exam  Patient: Anne Harvey   DOB: 11/14/1975   49 y.o. Female  MRN: [id number]  Subjective:    Chief Complaint  Patient presents with   Annual Exam    Anne Harvey is a 49 y.o. female who presents today for a complete physical exam. She reports consuming a low carbohydrate, low fat diet. Participating in regular exercise. She generally feels fairly well. She reports sleeping well. She does not have additional problems to discuss today.    Most recent fall risk assessment:    04/24/2024   10:05 AM  Fall Risk   Falls in the past year? 0  Number falls in past yr: 0  Injury with Fall? 0  Risk for fall due to : No Fall Risks  Follow up Falls evaluation completed     Most recent depression screenings:    04/24/2024   10:05 AM 04/13/2024    2:31 PM  PHQ 2/9 Scores  PHQ - 2 Score 0 0  PHQ- 9 Score 0     Vision:Within last year Dental: Receives regular dental care.  Past Medical History:  Diagnosis Date   Chronic headaches    Elevated liver enzymes    Hyperlipidemia    Hypertension    Stage 3a chronic kidney disease (CKD) (HCC)    Tachycardia    Type 2 diabetes mellitus with chronic kidney disease [facility])    Past Surgical History:  Procedure Laterality Date   CATARACT EXTRACTION W/PHACO Left 04/07/2024   Procedure: PHACOEMULSIFICATION, CATARACT, WITH IOL INSERTION 5.57 00:37.7;  Surgeon: Jaye Fallow, MD;  Location: [facility];  Service: Ophthalmology;  Laterality: Left;   PARTIAL HYSTERECTOMY     REDUCTION MAMMAPLASTY Bilateral 03/2022   Social History[1] Social History   Socioeconomic History   Marital status: Legally Separated    Spouse name: Not on file   Number of children: Not on file   Years of education: Not on file   Highest education level: Not on file  Occupational History   Not on file  Tobacco Use   Smoking status: Never   Smokeless tobacco: Never  Substance and Sexual Activity   Alcohol use: No    Drug use: Not on file   Sexual activity: Not on file  Other Topics Concern   Not on file  Social History Narrative   Not on file   Social Drivers of Health   Tobacco Use: Low Risk (04/24/2024)   Patient History    Smoking Tobacco Use: Never    Smokeless Tobacco Use: Never    Passive Exposure: Not on file  Financial Resource Strain: Patient Declined (12/11/2022)   Received from [facility]   Overall Financial Resource Strain (CARDIA)    Difficulty of Paying Living Expenses: Patient declined  Food Insecurity: No Food Insecurity (04/13/2024)   Epic    Worried About Radiation Protection Practitioner of Food in the Last Year: Never true    Ran Out of Food in the Last Year: Never true  Transportation Needs: No Transportation Needs (04/13/2024)   Epic    Lack of Transportation (Medical): No    Lack of Transportation (Non-Medical): No  Physical Activity: Insufficiently Active (12/20/2023)   Exercise Vital Sign    Days of Exercise per Week: 3 days    Minutes of Exercise per Session: 40 min  Stress: No Stress Concern Present (12/20/2023)   [employer] of Occupational Health - Occupational Stress Questionnaire    Feeling of Stress:  Only a little  Social Connections: Moderately Integrated (12/20/2023)   Social Connection and Isolation Panel    Frequency of Communication with Friends and Family: More than three times a week    Frequency of Social Gatherings with Friends and Family: Twice a week    Attends Religious Services: More than 4 times per year    Active Member of Clubs or Organizations: No    Attends Banker Meetings: Never    Marital Status: Married  Catering Manager Violence: Not At Risk (04/13/2024)   Epic    Fear of Current or Ex-Partner: No    Emotionally Abused: No    Physically Abused: No    Sexually Abused: No  Depression (PHQ2-9): Low Risk (04/24/2024)   Depression (PHQ2-9)    PHQ-2 Score: 0  Alcohol Screen: Low Risk (12/20/2023)   Alcohol Screen    Last  Alcohol Screening Score (AUDIT): 0  Housing: Low Risk (04/13/2024)   Epic    Unable to Pay for Housing in the Last Year: No    Number of Times Moved in the Last Year: 0    Homeless in the Last Year: No  Utilities: Not At Risk (04/13/2024)   Epic    Threatened with loss of utilities: No  Health Literacy: Adequate Health Literacy (12/20/2023)   B1300 Health Literacy    Frequency of need for help with medical instructions: Never   Family Status  Relation Name Status   Mother  Alive   Father  Alive   Sister tonia Alive   Sister  Deceased   Brother  Alive   Brother  Alive   PGM  (Not Specified)   Sister sister 2 Alive   Neg Hx  (Not Specified)  No partnership data on file   Family History  Problem Relation Age of Onset   High Cholesterol Mother    Hypertension Mother    Diabetes Father    High Cholesterol Father    Hypertension Father    Hypertension Sister    Cancer Sister    Healthy Brother    Healthy Brother    Ovarian cancer Paternal Grandmother    Diabetes Sister    High Cholesterol Sister    Hypertension Sister    Breast cancer Neg Hx    Allergies[2]    Patient Care Team: Billy Philippe SAUNDERS, NP as PCP - General (Family Medicine) Rennie Cindy SAUNDERS, MD as Consulting Physician (Oncology) Lateef, Munsoor, MD (Nephrology)   Show/hide medication list[3]  Review of Systems  Musculoskeletal:  Positive for joint pain (Left knee pain).   See HPI above     Objective:   BP 124/76   Pulse 82   Temp 98.5 F (36.9 C) (Oral)   Ht 5' 5 (1.651 m)   Wt 220 lb (99.8 kg)   SpO2 98%   BMI 36.61 kg/m  BP Readings from Last 3 Encounters:  04/24/24 124/76  04/13/24 131/87  04/07/24 (!) 178/107   Wt Readings from Last 3 Encounters:  04/24/24 220 lb (99.8 kg)  04/13/24 219 lb 8 oz (99.6 kg)  04/07/24 219 lb 14.4 oz (99.7 kg)      Physical Exam Vitals reviewed.  Constitutional:      General: She is not in acute distress.    Appearance: Normal appearance.  She is obese. She is not ill-appearing, toxic-appearing or diaphoretic.  HENT:     Head: Normocephalic and atraumatic.     Right Ear: Tympanic membrane, ear canal and external ear  normal. There is no impacted cerumen.     Left Ear: Tympanic membrane, ear canal and external ear normal. There is no impacted cerumen.     Nose:     Right Sinus: No maxillary sinus tenderness or frontal sinus tenderness.     Left Sinus: No maxillary sinus tenderness or frontal sinus tenderness.     Mouth/Throat:     Mouth: Mucous membranes are moist.     Pharynx: Oropharynx is clear. Uvula midline. No pharyngeal swelling, oropharyngeal exudate, posterior oropharyngeal erythema or uvula swelling.  Eyes:     General:        Right eye: No discharge.        Left eye: No discharge.     Conjunctiva/sclera: Conjunctivae normal.     Pupils: Pupils are equal, round, and reactive to light.  Neck:     Thyroid: No thyromegaly.  Cardiovascular:     Rate and Rhythm: Normal rate and regular rhythm.     Pulses:          Dorsalis pedis pulses are 2+ on the right side and 2+ on the left side.     Heart sounds: Normal heart sounds. No murmur heard.    No friction rub. No gallop.  Pulmonary:     Effort: Pulmonary effort is normal. No respiratory distress.     Breath sounds: Normal breath sounds.  Abdominal:     General: Abdomen is flat. Bowel sounds are normal. There is no distension.     Palpations: Abdomen is soft. There is no mass.     Tenderness: There is no abdominal tenderness.  Musculoskeletal:        General: Normal range of motion.     Cervical back: Normal range of motion.     Right lower leg: No edema.     Left lower leg: No edema.     Right foot: Swelling (Left big toe) and tenderness (Mild) present.     Comments: Right big toe: Scaly skin  Lymphadenopathy:     Cervical: No cervical adenopathy.  Skin:    General: Skin is warm and dry.  Neurological:     General: No focal deficit present.     Mental  Status: She is alert and oriented to person, place, and time. Mental status is at baseline.     Motor: No weakness.     Gait: Gait normal.  Psychiatric:        Mood and Affect: Mood normal.        Behavior: Behavior normal.        Thought Content: Thought content normal.        Judgment: Judgment normal.      No results found for any visits on 04/24/24. Last CBC Lab Results  Component Value Date   WBC 10.9 (H) 04/21/2024   HGB 14.3 04/21/2024   HCT 45.6 04/21/2024   MCV 78 (L) 04/21/2024   MCH 24.4 (L) 04/21/2024   RDW 15.4 04/21/2024   PLT 261 04/21/2024   Last metabolic panel Lab Results  Component Value Date   GLUCOSE 87 04/21/2024   NA 139 04/21/2024   K 4.2 04/21/2024   CL 99 04/21/2024   CO2 22 04/21/2024   BUN 17 04/21/2024   CREATININE 1.06 (H) 04/21/2024   EGFR 64 04/21/2024   CALCIUM  10.0 04/21/2024   PROT 7.8 04/21/2024   ALBUMIN 4.2 04/21/2024   LABGLOB 3.6 04/21/2024   BILITOT 0.5 04/21/2024   ALKPHOS 242 (H) 04/21/2024  AST 42 (H) 04/21/2024   ALT 79 (H) 04/21/2024   ANIONGAP 6 (L) 08/26/2011   Last lipids Lab Results  Component Value Date   CHOL 147 04/21/2024   HDL 46 04/21/2024   LDLCALC 88 04/21/2024   TRIG 65 04/21/2024   CHOLHDL 3.2 04/21/2024   Last hemoglobin A1c Lab Results  Component Value Date   HGBA1C 6.6 (H) 04/21/2024   Last thyroid functions Lab Results  Component Value Date   TSH 1.660 12/20/2023   Lab Results  Component Value Date   LABURIC 6.6 (H) 04/21/2024       Assessment & Plan:    Routine Health Maintenance and Physical Exam  Immunization History  Administered Date(s) Administered   PFIZER(Purple Top)SARS-COV-2 Vaccination 07/11/2019, 08/01/2019   Tdap 03/16/2011, 10/19/2020   Zoster Recombinant(Shingrix ) 01/17/2024    Health Maintenance  Topic Date Due   Pneumococcal Vaccine: 50+ Years (1 of 2 - PCV) Never done   Hepatitis B Vaccines 19-59 Average Risk (1 of 3 - 19+ 3-dose series) Never done    COVID-19 Vaccine (3 - Pfizer risk series) 08/29/2019   Influenza Vaccine  Never done   Zoster Vaccines- Shingrix  (2 of 2) 03/13/2024   HEMOGLOBIN A1C  10/19/2024   FOOT EXAM  01/16/2025   OPHTHALMOLOGY EXAM  03/16/2025   Diabetic kidney evaluation - eGFR measurement  04/21/2025   Diabetic kidney evaluation - Urine ACR  04/21/2025   Mammogram  01/05/2026   Cervical Cancer Screening (HPV/Pap Cotest)  11/08/2026   Colonoscopy  09/21/2029   DTaP/Tdap/Td (3 - Td or Tdap) 10/20/2030   Hepatitis C Screening  Completed   HIV Screening  Completed   HPV VACCINES  Aged Out   Meningococcal B Vaccine  Aged Out    Discussed health benefits of physical activity, and encouraged her to engage in regular exercise appropriate for her age and condition.  Annual physical exam  Hypertension, unspecified type -     Olmesartan  Medoxomil-HCTZ; Take 1 tablet by mouth daily.  Dispense: 90 tablet; Refill: 1  Hyperlipidemia, unspecified hyperlipidemia type -     Atorvastatin  Calcium ; Take 1 tablet (80 mg total) by mouth daily.  Dispense: 90 tablet; Refill: 1 -     Ezetimibe ; Take 1 tablet (10 mg total) by mouth daily.  Dispense: 90 tablet; Refill: 1  Acute gout due to renal impairment involving toe of right foot -     Colchicine ; Take 2 tablets today, then take 1 tablet daily for 2 days.  Dispense: 4 capsule; Refill: 2  Elevated liver enzymes -     Acute Hep Panel & Hep B Surface Ab  -Review health maintenance: Does not want any vaccines.  -Physical exam completed today.  -Discussed throughly about lab results from earlier this week.  *Decreased kidney function-recommend to stay hydrated with water 64-100oz a day; call back to [facility] to see if a follow up appointment is needed; will request from previous visits *Increased liver enzymes-will try to add on hepatitis panel from labs obtain earlier this week, if not, will need a redraw; suspect this is more related to fatty liver that was  seen on right upper quad ultrasound back on 02/13/2024. Discussed about possible referral to hepatologist, will wait on hepatitis panel results and try to work on lifestyle changes *A1c is still diabetic range with no medications. Still not in agreement with starting Ozempic for diabetes and fatty liver. Through shared decision making, decided to strongly work on lifestyle changes of a  healthy diet and exercise. If not improved in 3 months, will start Ozempic.  *Uric acid is elevated which signifies gout. Provided some general information about gout. Prescribed Colchicine  for gout flare up.  -Continue with all medications. *Refilled Atorvastatin  and Ezetimibe .  *Changed blood pressure medication into a combination since blood pressure is stable. Olmesartan -Hydrochlorothiazide  40-12.5mg  tablet daily.  -May take Tylenol Arthritis for left knee pain, no more than 3000mg /24 hr.  -Follow up in 3 months for chronic management. Message about a week before visit and will order labs to have them collected with [facility].   Return in about 13 weeks (around 07/24/2024) for chronic management.     Philippe Slade, NP      [1]  Social History Tobacco Use   Smoking status: Never   Smokeless tobacco: Never  Substance Use Topics   Alcohol use: No  [2]  Allergies Allergen Reactions   Morphine And Codeine Rash  [3]  Outpatient Medications Prior to Visit  Medication Sig   metoprolol succinate (TOPROL-XL) 100 MG 24 hr tablet TAKE 1 TABLET(100 MG) BY MOUTH DAILY   [DISCONTINUED] atorvastatin  (LIPITOR) 80 MG tablet Take 80 mg by mouth daily.   [DISCONTINUED] ezetimibe  (ZETIA ) 10 MG tablet TAKE 1 TABLET(10 MG) BY MOUTH DAILY   [DISCONTINUED] hydrochlorothiazide  (MICROZIDE ) 12.5 MG capsule Take 1 capsule (12.5 mg total) by mouth daily.   [DISCONTINUED] indomethacin (INDOCIN) 50 MG capsule Take 50 mg by mouth 3 (three) times daily with meals.   [DISCONTINUED] olmesartan  (BENICAR ) 40 MG tablet TAKE 1  TABLET(40 MG) BY MOUTH DAILY   No facility-administered medications prior to visit.   "

## 2024-04-27 ENCOUNTER — Encounter: Payer: Self-pay | Admitting: Internal Medicine

## 2024-04-28 ENCOUNTER — Ambulatory Visit: Payer: Self-pay | Admitting: Family Medicine

## 2024-04-28 ENCOUNTER — Inpatient Hospital Stay: Payer: Self-pay | Attending: Internal Medicine | Admitting: Internal Medicine

## 2024-04-28 ENCOUNTER — Encounter: Payer: Self-pay | Admitting: Internal Medicine

## 2024-04-28 VITALS — BP 137/87 | HR 65 | Temp 97.0°F | Resp 16 | Ht 65.0 in | Wt 222.2 lb

## 2024-04-28 DIAGNOSIS — Z79899 Other long term (current) drug therapy: Secondary | ICD-10-CM | POA: Insufficient documentation

## 2024-04-28 DIAGNOSIS — E785 Hyperlipidemia, unspecified: Secondary | ICD-10-CM | POA: Insufficient documentation

## 2024-04-28 DIAGNOSIS — D472 Monoclonal gammopathy: Secondary | ICD-10-CM | POA: Insufficient documentation

## 2024-04-28 DIAGNOSIS — F32A Depression, unspecified: Secondary | ICD-10-CM | POA: Insufficient documentation

## 2024-04-28 DIAGNOSIS — E1122 Type 2 diabetes mellitus with diabetic chronic kidney disease: Secondary | ICD-10-CM | POA: Insufficient documentation

## 2024-04-28 DIAGNOSIS — R7689 Other specified abnormal immunological findings in serum: Secondary | ICD-10-CM | POA: Diagnosis not present

## 2024-04-28 DIAGNOSIS — K760 Fatty (change of) liver, not elsewhere classified: Secondary | ICD-10-CM | POA: Insufficient documentation

## 2024-04-28 DIAGNOSIS — N1831 Chronic kidney disease, stage 3a: Secondary | ICD-10-CM | POA: Diagnosis not present

## 2024-04-28 DIAGNOSIS — I129 Hypertensive chronic kidney disease with stage 1 through stage 4 chronic kidney disease, or unspecified chronic kidney disease: Secondary | ICD-10-CM | POA: Insufficient documentation

## 2024-04-28 DIAGNOSIS — Z8041 Family history of malignant neoplasm of ovary: Secondary | ICD-10-CM | POA: Diagnosis not present

## 2024-04-28 LAB — ACUTE HEP PANEL AND HEP B SURFACE AB
Hep A IgM: NEGATIVE
Hep B C IgM: NEGATIVE
Hep C Virus Ab: NONREACTIVE
Hepatitis B Surf Ab Quant: <3.5 m[IU]/mL — ABNORMAL LOW
Hepatitis B Surface Ag: NEGATIVE

## 2024-04-28 NOTE — Progress Notes (Signed)
 [facility] CONSULT NOTE  Patient Care Team: Billy Philippe SAUNDERS, NP as PCP - General (Family Medicine) Rennie Cindy SAUNDERS, MD as Consulting Physician (Oncology) Marcelino Gales, MD (Nephrology)  CHIEF COMPLAINTS/PURPOSE OF CONSULTATION: Monoclonal gammopathy  HEMATOLOGY HISTORY  # CKD stage- [Dr.]  HISTORY OF PRESENTING ILLNESS:  Anne Harvey 49 y.o.  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  Patient denies any new symptoms.  Patient denies any new symptoms.  Review of Systems  Constitutional:  Negative for chills, diaphoresis, fever, malaise/fatigue and weight loss.  HENT:  Negative for nosebleeds and sore throat.   Eyes:  Negative for double vision.  Respiratory:  Negative for cough, hemoptysis, sputum production, shortness of breath and wheezing.   Cardiovascular:  Negative for chest pain, palpitations, orthopnea and leg swelling.  Gastrointestinal:  Negative for abdominal pain, blood in stool, constipation, diarrhea, heartburn, melena, nausea and vomiting.  Genitourinary:  Negative for dysuria, frequency and urgency.  Musculoskeletal:  Negative for back pain and joint pain.  Skin: Negative.  Negative for itching and rash.  Neurological:  Negative for dizziness, tingling, focal weakness, weakness and headaches.  Endo/Heme/Allergies:  Does not bruise/bleed easily.  Psychiatric/Behavioral:  Negative for depression. The patient is not nervous/anxious and does not have insomnia.     MEDICAL HISTORY:  Past Medical History:  Diagnosis Date   Chronic headaches    Elevated liver enzymes    Hyperlipidemia    Hypertension    Stage 3a chronic kidney disease (CKD) (HCC)    Tachycardia    Type 2 diabetes mellitus with chronic kidney disease (HCC)     SURGICAL HISTORY: Past Surgical History:  Procedure Laterality Date   CATARACT EXTRACTION W/PHACO Left 04/07/2024   Procedure: PHACOEMULSIFICATION, CATARACT, WITH IOL  INSERTION 5.57 00:37.7;  Surgeon: Jaye Fallow, MD;  Location: [facility];  Service: Ophthalmology;  Laterality: Left;   PARTIAL HYSTERECTOMY     REDUCTION MAMMAPLASTY Bilateral 03/2022    SOCIAL HISTORY: Social History   Socioeconomic History   Marital status: Legally Separated    Spouse name: Not on file   Number of children: Not on file   Years of education: Not on file   Highest education level: Not on file  Occupational History   Not on file  Tobacco Use   Smoking status: Never   Smokeless tobacco: Never  Substance and Sexual Activity   Alcohol use: No   Drug use: Not on file   Sexual activity: Not on file  Other Topics Concern   Not on file  Social History Narrative   Not on file   Social Drivers of Health   Tobacco Use: Low Risk (04/28/2024)   Patient History    Smoking Tobacco Use: Never    Smokeless Tobacco Use: Never    Passive Exposure: Not on file  Financial Resource Strain: Patient Declined (12/11/2022)   Received from [facility]   Overall Financial Resource Strain (CARDIA)    Difficulty of Paying Living Expenses: Patient declined  Food Insecurity: No Food Insecurity (04/13/2024)   Epic    Worried About Radiation Protection Practitioner of Food in the Last Year: Never true    Ran Out of Food in the Last Year: Never true  Transportation Needs: No Transportation Needs (04/13/2024)   Epic    Lack of Transportation (Medical): No    Lack of Transportation (Non-Medical): No  Physical Activity: Insufficiently Active (12/20/2023)   Exercise Vital Sign  Days of Exercise per Week: 3 days    Minutes of Exercise per Session: 40 min  Stress: No Stress Concern Present (12/20/2023)   [employer] of Occupational Health - Occupational Stress Questionnaire    Feeling of Stress: Only a little  Social Connections: Moderately Integrated (12/20/2023)   Social Connection and Isolation Panel    Frequency of Communication with Friends and Family: More than three  times a week    Frequency of Social Gatherings with Friends and Family: Twice a week    Attends Religious Services: More than 4 times per year    Active Member of Clubs or Organizations: No    Attends Banker Meetings: Never    Marital Status: Married  Catering Manager Violence: Not At Risk (04/13/2024)   Epic    Fear of Current or Ex-Partner: No    Emotionally Abused: No    Physically Abused: No    Sexually Abused: No  Depression (PHQ2-9): Low Risk (04/28/2024)   Depression (PHQ2-9)    PHQ-2 Score: 0  Alcohol Screen: Low Risk (12/20/2023)   Alcohol Screen    Last Alcohol Screening Score (AUDIT): 0  Housing: Low Risk (04/13/2024)   Epic    Unable to Pay for Housing in the Last Year: No    Number of Times Moved in the Last Year: 0    Homeless in the Last Year: No  Utilities: Not At Risk (04/13/2024)   Epic    Threatened with loss of utilities: No  Health Literacy: Adequate Health Literacy (12/20/2023)   B1300 Health Literacy    Frequency of need for help with medical instructions: Never    FAMILY HISTORY: Family History  Problem Relation Age of Onset   High Cholesterol Mother    Hypertension Mother    Diabetes Father    High Cholesterol Father    Hypertension Father    Hypertension Sister    Cancer Sister    Healthy Brother    Healthy Brother    Ovarian cancer Paternal Grandmother    Diabetes Sister    High Cholesterol Sister    Hypertension Sister    Breast cancer Neg Hx     ALLERGIES:  is allergic to morphine and codeine.  MEDICATIONS:  Current Outpatient Medications  Medication Sig Dispense Refill   atorvastatin  (LIPITOR) 80 MG tablet Take 1 tablet (80 mg total) by mouth daily. 90 tablet 1   Colchicine  0.6 MG CAPS Take 2 tablets today, then take 1 tablet daily for 2 days. 4 capsule 2   ezetimibe  (ZETIA ) 10 MG tablet Take 1 tablet (10 mg total) by mouth daily. 90 tablet 1   metoprolol succinate (TOPROL-XL) 100 MG 24 hr tablet TAKE 1 TABLET(100 MG) BY  MOUTH DAILY 90 tablet 1   olmesartan -hydrochlorothiazide  (BENICAR  HCT) 40-12.5 MG tablet Take 1 tablet by mouth daily. 90 tablet 1   No current facility-administered medications for this visit.      PHYSICAL EXAMINATION:   Vitals:   04/28/24 1452  BP: 137/87  Pulse: 65  Resp: 16  Temp: (!) 97 F (36.1 C)  SpO2: 99%   Filed Weights   04/28/24 1452  Weight: 222 lb 3.2 oz (100.8 kg)    Physical Exam Vitals and nursing note reviewed.  HENT:     Head: Normocephalic and atraumatic.     Mouth/Throat:     Pharynx: Oropharynx is clear.  Eyes:     Extraocular Movements: Extraocular movements intact.     Pupils: Pupils are  equal, round, and reactive to light.  Cardiovascular:     Rate and Rhythm: Normal rate and regular rhythm.  Pulmonary:     Comments: Decreased breath sounds bilaterally.  Abdominal:     Palpations: Abdomen is soft.  Musculoskeletal:        General: Normal range of motion.     Cervical back: Normal range of motion.  Skin:    General: Skin is warm.  Neurological:     General: No focal deficit present.     Mental Status: She is alert and oriented to person, place, and time.  Psychiatric:        Behavior: Behavior normal.        Judgment: Judgment normal.     LABORATORY DATA:  I have reviewed the data as listed Lab Results  Component Value Date   WBC 10.9 (H) 04/21/2024   HGB 14.3 04/21/2024   HCT 45.6 04/21/2024   MCV 78 (L) 04/21/2024   PLT 261 04/21/2024   Recent Labs    12/20/23 1054 01/17/24 1505 02/03/24 1351 04/21/24 1305  NA 140 138 139 139  K 4.0 3.8 4.0 4.2  CL 101 101 102 99  CO2 23 27 23 22   GLUCOSE 97 77 80 87  BUN 15 18 22 17   CREATININE 1.03* 1.10* 1.27* 1.06*  CALCIUM  9.9 9.5 9.8 10.0  PROT 8.0 7.6 7.6 7.8  ALBUMIN 4.0  --  4.1 4.2  AST 27 30 36 42*  ALT 35* 51* 56* 79*  ALKPHOS 293*  --  208* 242*  BILITOT 0.6 0.5 0.6 0.5     DG Bone Survey Met Result Date: 04/25/2024 CLINICAL DATA:  Monoclonal gammopathy.   Multiple myeloma. EXAM: METASTATIC BONE SURVEY COMPARISON:  Chest radiographs dated 08/26/2011 FINDINGS: No lytic lesions. Mild thoracolumbar scoliosis. Mild cervical and minimal thoracic and lumbar spine degenerative changes. IMPRESSION: 1. No lytic lesions. 2. Mild thoracolumbar scoliosis. Electronically Signed   By: Elspeth Bathe M.D.   On: 04/25/2024 14:59    No results found for: KPAFRELGTCHN, LAMBDASER, KAPLAMBRATIO   Monoclonal gammopathy     Increased immunoglobulin # DEC 2025-  [M protein= 0.5 gm/dl]; Hb 13; GFR-54 [Dr.Lateef].   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  Skeletal survey-unremarkable notice of any myelomatous lesions..   # I reviewed the discrepant results of the blood work-however my clinical suspicion of any serious/or myeloma disorder is quite small.  Recommend holding bone marrow biopsy.  Will repeat myeloma workup again in 12 months.  # Chronic kidney disease-stage-III [DEC 2025- US - kidney-] ; followed by /nephrology, Dr.Lateef.    # Elevated LFTs/history of fatty liver as per patient-  discussed importance of healthy weight/and weight loss.  Strongly recommend eating more green leafy vegetables and cutting down processed food/ carbohydrates.  Instead increasing whole grains / protein in the diet.  Multiple studies have shown that optimal weight would help improve cardiovascular risk; also shown to cut on the risk of malignancies-colon cancer, breast cancer ovarian/uterine cancer in women.  Also recommend talking to PCP regarding weight loss medications-which can help with her high blood pressure; kidney disease; liver; and also risk of malignancies.   # DISPOSITION: # follow up in  12 months MD- 2 weeks prior- labs PLEASE ORDER  [facility] -MM panel; cbc/cmp; K/l light chains  DrRONITAB  All questions were answered. The patient knows to call the clinic with any problems, questions or concerns.      Cindy JONELLE Joe, MD 04/28/2024 3:41 PM

## 2024-04-28 NOTE — Assessment & Plan Note (Signed)
 SABRA

## 2024-04-28 NOTE — Progress Notes (Signed)
 No concerns other than lab results.

## 2024-04-28 NOTE — Assessment & Plan Note (Addendum)
#   DEC 2025-  [M protein= 0.5 gm/dl]; Hb 13; GFR-54 [Dr.Lateef].   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  Skeletal survey-unremarkable notice of any myelomatous lesions..   # I reviewed the discrepant results of the blood work-however my clinical suspicion of any serious/or myeloma disorder is quite small.  Recommend holding bone marrow biopsy.  Will repeat myeloma workup again in 12 months.  # Chronic kidney disease-stage-III [DEC 2025- [facility] - kidney-] ; followed by /nephrology, Dr.Lateef.    # Elevated LFTs/history of fatty liver as per patient-  discussed importance of healthy weight/and weight loss.  Strongly recommend eating more green leafy vegetables and cutting down processed food/ carbohydrates.  Instead increasing whole grains / protein in the diet.  Multiple studies have shown that optimal weight would help improve cardiovascular risk; also shown to cut on the risk of malignancies-colon cancer, breast cancer ovarian/uterine cancer in women.  Also recommend talking to PCP regarding weight loss medications-which can help with her high blood pressure; kidney disease; liver; and also risk of malignancies.   # DISPOSITION: # follow up in  12 months MD- 2 weeks prior- labs PLEASE ORDER [facility] -MM panel; cbc/cmp; K/l light chains  DrRONITAB

## 2024-05-15 LAB — COMPREHENSIVE METABOLIC PANEL WITH GFR
ALT: 79 [IU]/L — ABNORMAL HIGH (ref 0–32)
AST: 42 [IU]/L — ABNORMAL HIGH (ref 0–40)
Albumin: 4.2 g/dL (ref 3.9–4.9)
Alkaline Phosphatase: 242 [IU]/L — ABNORMAL HIGH (ref 41–116)
BUN/Creatinine Ratio: 16 (ref 9–23)
BUN: 17 mg/dL (ref 6–24)
Bilirubin Total: 0.5 mg/dL (ref 0.0–1.2)
CO2: 22 mmol/L (ref 20–29)
Calcium: 10 mg/dL (ref 8.7–10.2)
Chloride: 99 mmol/L (ref 96–106)
Creatinine, Ser: 1.06 mg/dL — ABNORMAL HIGH (ref 0.57–1.00)
Globulin, Total: 3.6 g/dL (ref 1.5–4.5)
Glucose: 87 mg/dL (ref 70–99)
Potassium: 4.2 mmol/L (ref 3.5–5.2)
Sodium: 139 mmol/L (ref 134–144)
Total Protein: 7.8 g/dL (ref 6.0–8.5)
eGFR: 64 mL/min/{1.73_m2}

## 2024-05-15 LAB — SPECIMEN STATUS REPORT

## 2024-07-24 ENCOUNTER — Ambulatory Visit: Admitting: Family Medicine

## 2025-04-28 ENCOUNTER — Inpatient Hospital Stay: Admitting: Internal Medicine
